# Patient Record
Sex: Female | Born: 1950 | Race: Black or African American | Hispanic: No | Marital: Married | Smoking: Never smoker
Health system: Southern US, Community
[De-identification: ages and names within clinical notes are randomized; demographics above are authoritative.]

## PROBLEM LIST (undated history)

## (undated) DIAGNOSIS — I10 Essential (primary) hypertension: Secondary | ICD-10-CM

## (undated) DIAGNOSIS — E785 Hyperlipidemia, unspecified: Secondary | ICD-10-CM

## (undated) DIAGNOSIS — K219 Gastro-esophageal reflux disease without esophagitis: Secondary | ICD-10-CM

## (undated) DIAGNOSIS — C801 Malignant (primary) neoplasm, unspecified: Secondary | ICD-10-CM

## (undated) HISTORY — DX: Malignant (primary) neoplasm, unspecified: C80.1

## (undated) HISTORY — DX: Hyperlipidemia, unspecified: E78.5

## (undated) HISTORY — PX: VAGINAL HYSTERECTOMY: SUR661

## (undated) HISTORY — DX: Essential (primary) hypertension: I10

## (undated) HISTORY — DX: Gastro-esophageal reflux disease without esophagitis: K21.9

## (undated) HISTORY — PX: TONSILLECTOMY: SUR1361

---

## 1998-04-30 ENCOUNTER — Other Ambulatory Visit: Admission: RE | Admit: 1998-04-30 | Discharge: 1998-04-30 | Payer: Self-pay | Admitting: Family Medicine

## 1999-04-29 ENCOUNTER — Other Ambulatory Visit: Admission: RE | Admit: 1999-04-29 | Discharge: 1999-04-29 | Payer: Self-pay | Admitting: Family Medicine

## 2000-05-04 ENCOUNTER — Other Ambulatory Visit: Admission: RE | Admit: 2000-05-04 | Discharge: 2000-05-04 | Payer: Self-pay | Admitting: Obstetrics and Gynecology

## 2001-06-28 ENCOUNTER — Other Ambulatory Visit: Admission: RE | Admit: 2001-06-28 | Discharge: 2001-06-28 | Payer: Self-pay | Admitting: Obstetrics and Gynecology

## 2002-08-01 ENCOUNTER — Other Ambulatory Visit: Admission: RE | Admit: 2002-08-01 | Discharge: 2002-08-01 | Payer: Self-pay | Admitting: Obstetrics and Gynecology

## 2003-06-16 ENCOUNTER — Other Ambulatory Visit: Admission: RE | Admit: 2003-06-16 | Discharge: 2003-06-16 | Payer: Self-pay | Admitting: Obstetrics and Gynecology

## 2004-07-23 ENCOUNTER — Ambulatory Visit: Payer: Self-pay | Admitting: Family Medicine

## 2004-08-13 ENCOUNTER — Ambulatory Visit: Payer: Self-pay | Admitting: Family Medicine

## 2004-10-18 ENCOUNTER — Ambulatory Visit: Payer: Self-pay | Admitting: Family Medicine

## 2004-10-28 ENCOUNTER — Ambulatory Visit: Payer: Self-pay | Admitting: Family Medicine

## 2004-11-04 ENCOUNTER — Encounter: Admission: RE | Admit: 2004-11-04 | Discharge: 2005-02-02 | Payer: Self-pay | Admitting: Family Medicine

## 2005-01-31 ENCOUNTER — Ambulatory Visit: Payer: Self-pay | Admitting: Family Medicine

## 2005-04-28 ENCOUNTER — Encounter: Admission: RE | Admit: 2005-04-28 | Discharge: 2005-07-27 | Payer: Self-pay | Admitting: Family Medicine

## 2005-07-01 ENCOUNTER — Ambulatory Visit: Payer: Self-pay | Admitting: Family Medicine

## 2005-08-26 ENCOUNTER — Ambulatory Visit: Payer: Self-pay | Admitting: Family Medicine

## 2005-09-23 ENCOUNTER — Ambulatory Visit: Payer: Self-pay | Admitting: Family Medicine

## 2005-11-06 ENCOUNTER — Ambulatory Visit: Payer: Self-pay | Admitting: Family Medicine

## 2006-09-08 ENCOUNTER — Ambulatory Visit: Payer: Self-pay | Admitting: Family Medicine

## 2006-09-08 LAB — CONVERTED CEMR LAB
Albumin: 3.8 g/dL (ref 3.5–5.2)
Alkaline Phosphatase: 36 units/L — ABNORMAL LOW (ref 39–117)
Basophils Absolute: 0 10*3/uL (ref 0.0–0.1)
Basophils Relative: 0.1 % (ref 0.0–1.0)
Bilirubin, Direct: 0.1 mg/dL (ref 0.0–0.3)
CO2: 30 meq/L (ref 19–32)
Chloride: 104 meq/L (ref 96–112)
Creatinine, Ser: 0.8 mg/dL (ref 0.4–1.2)
Eosinophils Relative: 2.8 % (ref 0.0–5.0)
HCT: 36.2 % (ref 36.0–46.0)
Hemoglobin: 11.8 g/dL — ABNORMAL LOW (ref 12.0–15.0)
Hgb A1c MFr Bld: 7.4 % — ABNORMAL HIGH (ref 4.6–6.0)
LDL Cholesterol: 81 mg/dL (ref 0–99)
MCHC: 32.5 g/dL (ref 30.0–36.0)
Microalb Creat Ratio: 4.5 mg/g (ref 0.0–30.0)
Microalb, Ur: 0.7 mg/dL (ref 0.0–1.9)
Neutrophils Relative %: 58.3 % (ref 43.0–77.0)
RBC: 4.99 M/uL (ref 3.87–5.11)
RDW: 12.9 % (ref 11.5–14.6)
Sodium: 140 meq/L (ref 135–145)
Total CHOL/HDL Ratio: 3.7
VLDL: 19 mg/dL (ref 0–40)
WBC: 6.3 10*3/uL (ref 4.5–10.5)

## 2006-09-11 DIAGNOSIS — M1990 Unspecified osteoarthritis, unspecified site: Secondary | ICD-10-CM

## 2006-09-11 DIAGNOSIS — K219 Gastro-esophageal reflux disease without esophagitis: Secondary | ICD-10-CM

## 2006-09-11 DIAGNOSIS — J309 Allergic rhinitis, unspecified: Secondary | ICD-10-CM | POA: Insufficient documentation

## 2006-09-14 ENCOUNTER — Ambulatory Visit: Payer: Self-pay | Admitting: Family Medicine

## 2006-11-20 ENCOUNTER — Encounter: Payer: Self-pay | Admitting: Family Medicine

## 2006-12-17 ENCOUNTER — Ambulatory Visit: Payer: Self-pay | Admitting: Family Medicine

## 2006-12-17 LAB — CONVERTED CEMR LAB
CO2: 29 meq/L (ref 19–32)
Calcium: 9.3 mg/dL (ref 8.4–10.5)
GFR calc Af Amer: 96 mL/min
GFR calc non Af Amer: 79 mL/min
Glucose, Bld: 141 mg/dL — ABNORMAL HIGH (ref 70–99)
Potassium: 4 meq/L (ref 3.5–5.1)
Sodium: 138 meq/L (ref 135–145)

## 2007-09-07 ENCOUNTER — Ambulatory Visit: Payer: Self-pay | Admitting: Family Medicine

## 2007-09-07 LAB — CONVERTED CEMR LAB
ALT: 24 units/L (ref 0–35)
AST: 19 units/L (ref 0–37)
Albumin: 3.5 g/dL (ref 3.5–5.2)
Alkaline Phosphatase: 40 units/L (ref 39–117)
BUN: 16 mg/dL (ref 6–23)
Basophils Absolute: 0 10*3/uL (ref 0.0–0.1)
Basophils Relative: 0.2 % (ref 0.0–1.0)
Bilirubin Urine: NEGATIVE
Bilirubin, Direct: 0.1 mg/dL (ref 0.0–0.3)
CO2: 28 meq/L (ref 19–32)
Calcium: 9 mg/dL (ref 8.4–10.5)
Chloride: 108 meq/L (ref 96–112)
Cholesterol: 112 mg/dL (ref 0–200)
Creatinine, Ser: 0.9 mg/dL (ref 0.4–1.2)
Creatinine,U: 135 mg/dL
Eosinophils Absolute: 0.1 10*3/uL (ref 0.0–0.7)
Eosinophils Relative: 2.3 % (ref 0.0–5.0)
GFR calc Af Amer: 84 mL/min
GFR calc non Af Amer: 69 mL/min
Glucose, Bld: 156 mg/dL — ABNORMAL HIGH (ref 70–99)
HCT: 35.4 % — ABNORMAL LOW (ref 36.0–46.0)
HDL: 26.2 mg/dL — ABNORMAL LOW (ref >39.0)
Hemoglobin: 11.1 g/dL — ABNORMAL LOW (ref 12.0–15.0)
Hgb A1c MFr Bld: 8.3 % — ABNORMAL HIGH (ref 4.6–6.0)
LDL Cholesterol: 71 mg/dL (ref 0–99)
Lymphocytes Relative: 33.6 % (ref 12.0–46.0)
MCHC: 31.3 g/dL (ref 30.0–36.0)
MCV: 73.4 fL — ABNORMAL LOW (ref 78.0–100.0)
Microalb Creat Ratio: 10.4 mg/g (ref 0.0–30.0)
Microalb, Ur: 1.4 mg/dL (ref 0.0–1.9)
Monocytes Absolute: 0.4 10*3/uL (ref 0.1–1.0)
Monocytes Relative: 7.4 % (ref 3.0–12.0)
Neutro Abs: 3.2 10*3/uL (ref 1.4–7.7)
Neutrophils Relative %: 56.5 % (ref 43.0–77.0)
Nitrite: NEGATIVE
Platelets: 194 10*3/uL (ref 150–400)
Potassium: 3.7 meq/L (ref 3.5–5.1)
RBC: 4.83 M/uL (ref 3.87–5.11)
RDW: 13.1 % (ref 11.5–14.6)
Sodium: 141 meq/L (ref 135–145)
TSH: 1.03 microintl units/mL (ref 0.35–5.50)
Total Bilirubin: 0.7 mg/dL (ref 0.3–1.2)
Total CHOL/HDL Ratio: 4.3
Total Protein: 6.8 g/dL (ref 6.0–8.3)
Triglycerides: 73 mg/dL (ref 0–149)
Urobilinogen, UA: 1
VLDL: 15 mg/dL (ref 0–40)
WBC Urine, dipstick: NEGATIVE
WBC: 5.5 10*3/uL (ref 4.5–10.5)

## 2007-09-14 ENCOUNTER — Ambulatory Visit: Payer: Self-pay | Admitting: Family Medicine

## 2007-09-14 DIAGNOSIS — E785 Hyperlipidemia, unspecified: Secondary | ICD-10-CM | POA: Insufficient documentation

## 2007-09-14 DIAGNOSIS — I10 Essential (primary) hypertension: Secondary | ICD-10-CM

## 2007-10-28 ENCOUNTER — Telehealth: Payer: Self-pay | Admitting: Family Medicine

## 2007-11-03 ENCOUNTER — Encounter: Payer: Self-pay | Admitting: *Deleted

## 2007-12-21 ENCOUNTER — Ambulatory Visit: Payer: Self-pay | Admitting: Family Medicine

## 2007-12-21 DIAGNOSIS — E109 Type 1 diabetes mellitus without complications: Secondary | ICD-10-CM

## 2008-01-09 DIAGNOSIS — R05 Cough: Secondary | ICD-10-CM | POA: Insufficient documentation

## 2008-01-10 ENCOUNTER — Ambulatory Visit: Payer: Self-pay | Admitting: Family Medicine

## 2008-01-10 DIAGNOSIS — J069 Acute upper respiratory infection, unspecified: Secondary | ICD-10-CM | POA: Insufficient documentation

## 2008-01-31 ENCOUNTER — Ambulatory Visit: Payer: Self-pay | Admitting: Family Medicine

## 2008-02-23 ENCOUNTER — Ambulatory Visit: Payer: Self-pay | Admitting: Family Medicine

## 2008-02-23 LAB — CONVERTED CEMR LAB
CO2: 30 meq/L (ref 19–32)
Chloride: 103 meq/L (ref 96–112)
Glucose, Bld: 116 mg/dL — ABNORMAL HIGH (ref 70–99)
Hgb A1c MFr Bld: 7 % — ABNORMAL HIGH (ref 4.6–6.0)
Sodium: 140 meq/L (ref 135–145)

## 2008-03-02 ENCOUNTER — Ambulatory Visit: Payer: Self-pay | Admitting: Family Medicine

## 2008-05-12 ENCOUNTER — Ambulatory Visit: Payer: Self-pay | Admitting: Family Medicine

## 2008-05-12 LAB — CONVERTED CEMR LAB
CO2: 30 meq/L (ref 19–32)
Chloride: 104 meq/L (ref 96–112)
Glucose, Bld: 72 mg/dL (ref 70–99)
Hgb A1c MFr Bld: 6.8 % — ABNORMAL HIGH (ref 4.6–6.0)
Potassium: 3.9 meq/L (ref 3.5–5.1)
Sodium: 141 meq/L (ref 135–145)

## 2008-05-22 ENCOUNTER — Ambulatory Visit: Payer: Self-pay | Admitting: Family Medicine

## 2008-06-26 ENCOUNTER — Ambulatory Visit: Payer: Self-pay | Admitting: Family Medicine

## 2008-09-15 ENCOUNTER — Ambulatory Visit: Payer: Self-pay | Admitting: Family Medicine

## 2008-09-15 LAB — CONVERTED CEMR LAB
Albumin: 3.7 g/dL (ref 3.5–5.2)
Alkaline Phosphatase: 46 units/L (ref 39–117)
BUN: 15 mg/dL (ref 6–23)
Basophils Absolute: 0 10*3/uL (ref 0.0–0.1)
Bilirubin Urine: NEGATIVE
CO2: 30 meq/L (ref 19–32)
Calcium: 9.1 mg/dL (ref 8.4–10.5)
Cholesterol: 114 mg/dL (ref 0–200)
Creatinine,U: 152.3 mg/dL
Eosinophils Absolute: 0.2 10*3/uL (ref 0.0–0.7)
Glucose, Bld: 117 mg/dL — ABNORMAL HIGH (ref 70–99)
Glucose, Urine, Semiquant: NEGATIVE
HDL: 40 mg/dL (ref >39.00)
Hemoglobin: 11.2 g/dL — ABNORMAL LOW (ref 12.0–15.0)
Ketones, urine, test strip: NEGATIVE
Lymphocytes Relative: 34.1 % (ref 12.0–46.0)
Lymphs Abs: 1.8 10*3/uL (ref 0.7–4.0)
MCHC: 32.7 g/dL (ref 30.0–36.0)
Microalb, Ur: 1.5 mg/dL (ref 0.0–1.9)
Neutro Abs: 3 10*3/uL (ref 1.4–7.7)
Platelets: 175 10*3/uL (ref 150.0–400.0)
RDW: 13.3 % (ref 11.5–14.6)
Sodium: 144 meq/L (ref 135–145)
Specific Gravity, Urine: 1.02
TSH: 1.1 microintl units/mL (ref 0.35–5.50)
Triglycerides: 71 mg/dL (ref 0.0–149.0)
WBC Urine, dipstick: NEGATIVE
pH: 6

## 2008-09-22 ENCOUNTER — Ambulatory Visit: Payer: Self-pay | Admitting: Family Medicine

## 2008-09-22 DIAGNOSIS — D649 Anemia, unspecified: Secondary | ICD-10-CM

## 2008-11-20 ENCOUNTER — Telehealth: Payer: Self-pay | Admitting: Family Medicine

## 2009-02-19 ENCOUNTER — Encounter (INDEPENDENT_AMBULATORY_CARE_PROVIDER_SITE_OTHER): Payer: Self-pay | Admitting: *Deleted

## 2009-02-22 ENCOUNTER — Encounter: Payer: Self-pay | Admitting: Family Medicine

## 2009-04-11 ENCOUNTER — Ambulatory Visit: Payer: Self-pay | Admitting: Family Medicine

## 2009-04-11 LAB — CONVERTED CEMR LAB
BUN: 14 mg/dL (ref 6–23)
Calcium: 9.3 mg/dL (ref 8.4–10.5)
Creatinine, Ser: 0.8 mg/dL (ref 0.4–1.2)
GFR calc non Af Amer: 94.97 mL/min (ref >60)
Hgb A1c MFr Bld: 7.1 % — ABNORMAL HIGH (ref 4.6–6.5)

## 2009-04-30 ENCOUNTER — Ambulatory Visit: Payer: Self-pay | Admitting: Family Medicine

## 2009-07-16 ENCOUNTER — Ambulatory Visit: Payer: Self-pay | Admitting: Family Medicine

## 2009-07-16 LAB — CONVERTED CEMR LAB
Calcium: 8.7 mg/dL (ref 8.4–10.5)
Creatinine, Ser: 0.7 mg/dL (ref 0.4–1.2)
Hgb A1c MFr Bld: 6.7 % — ABNORMAL HIGH (ref 4.6–6.5)

## 2009-07-19 DIAGNOSIS — B029 Zoster without complications: Secondary | ICD-10-CM | POA: Insufficient documentation

## 2009-07-23 ENCOUNTER — Ambulatory Visit: Payer: Self-pay | Admitting: Family Medicine

## 2009-08-01 ENCOUNTER — Telehealth: Payer: Self-pay | Admitting: Family Medicine

## 2009-08-14 ENCOUNTER — Telehealth: Payer: Self-pay | Admitting: Family Medicine

## 2009-09-28 ENCOUNTER — Ambulatory Visit: Payer: Self-pay | Admitting: Family Medicine

## 2009-09-28 LAB — CONVERTED CEMR LAB
ALT: 22 units/L (ref 0–35)
AST: 17 units/L (ref 0–37)
Albumin: 3.9 g/dL (ref 3.5–5.2)
Basophils Absolute: 0 10*3/uL (ref 0.0–0.1)
Basophils Relative: 0.3 % (ref 0.0–3.0)
Eosinophils Relative: 2 % (ref 0.0–5.0)
GFR calc non Af Amer: 90.87 mL/min (ref >60)
Glucose, Bld: 85 mg/dL (ref 70–99)
Glucose, Urine, Semiquant: NEGATIVE
HCT: 35.3 % — ABNORMAL LOW (ref 36.0–46.0)
Hemoglobin: 11.4 g/dL — ABNORMAL LOW (ref 12.0–15.0)
Lymphs Abs: 2.1 10*3/uL (ref 0.7–4.0)
Monocytes Relative: 7.3 % (ref 3.0–12.0)
Neutro Abs: 3.4 10*3/uL (ref 1.4–7.7)
Potassium: 4.3 meq/L (ref 3.5–5.1)
Protein, U semiquant: NEGATIVE
RBC: 4.77 M/uL (ref 3.87–5.11)
RDW: 15.1 % — ABNORMAL HIGH (ref 11.5–14.6)
Sodium: 142 meq/L (ref 135–145)
Specific Gravity, Urine: 1.015
TSH: 1.32 microintl units/mL (ref 0.35–5.50)
Total Protein: 7.1 g/dL (ref 6.0–8.3)
VLDL: 14.4 mg/dL (ref 0.0–40.0)
WBC Urine, dipstick: NEGATIVE
pH: 6

## 2009-10-05 ENCOUNTER — Ambulatory Visit: Payer: Self-pay | Admitting: Family Medicine

## 2009-11-30 ENCOUNTER — Encounter: Payer: Self-pay | Admitting: Family Medicine

## 2009-11-30 ENCOUNTER — Ambulatory Visit: Payer: Self-pay | Admitting: Internal Medicine

## 2010-01-04 ENCOUNTER — Ambulatory Visit: Payer: Self-pay | Admitting: Family Medicine

## 2010-01-04 LAB — CONVERTED CEMR LAB
BUN: 16 mg/dL (ref 6–23)
CO2: 31 meq/L (ref 19–32)
Chloride: 103 meq/L (ref 96–112)
Creatinine, Ser: 0.8 mg/dL (ref 0.4–1.2)
Glucose, Bld: 94 mg/dL (ref 70–99)
Potassium: 4.3 meq/L (ref 3.5–5.1)

## 2010-01-11 ENCOUNTER — Ambulatory Visit: Payer: Self-pay | Admitting: Family Medicine

## 2010-03-05 ENCOUNTER — Encounter: Payer: Self-pay | Admitting: Family Medicine

## 2010-04-05 ENCOUNTER — Ambulatory Visit: Payer: Self-pay | Admitting: Family Medicine

## 2010-04-05 LAB — CONVERTED CEMR LAB
BUN: 13 mg/dL (ref 6–23)
CO2: 27 meq/L (ref 19–32)
Calcium: 8.6 mg/dL (ref 8.4–10.5)
Chloride: 104 meq/L (ref 96–112)
Creatinine, Ser: 0.7 mg/dL (ref 0.4–1.2)
Hgb A1c MFr Bld: 7.1 % — ABNORMAL HIGH (ref 4.6–6.5)

## 2010-04-16 ENCOUNTER — Telehealth: Payer: Self-pay | Admitting: Family Medicine

## 2010-04-18 ENCOUNTER — Ambulatory Visit
Admission: RE | Admit: 2010-04-18 | Discharge: 2010-04-18 | Payer: Self-pay | Source: Home / Self Care | Attending: Family Medicine | Admitting: Family Medicine

## 2010-05-14 NOTE — Assessment & Plan Note (Signed)
Summary: CPX/NO PAP/NJR   Vital Signs:  Patient profile:   60 year old female Menstrual status:  postmenopausal Height:      66 inches Weight:      179 pounds Temp:     98.1 degrees F oral BP sitting:   120 / 80  (left arm) Cuff size:   regular  Vitals Entered By: Kern Reap CMA Duncan Dull) (October 05, 2009 9:44 AM) CC: cpx   CC:  cpx.  History of Present Illness: Anna Wiggins is a 60 year old female, nonsmoker, who comes in today for evaluation of diabetes, hypertension.  Her diabetes is treated with Glucotrol 10 mg b.i.d., Glucophage 500 mg b.i.d., and insulin NovoLog mixed 70 -- 3020 units daily.  Blood sugar varies from 80 to 180.  She states that she follows a strict diet.  Her blood sugar stays fairly normal.  If it doesn't her blood sugar goes up.  Sugar the other day was 85, however, hemoglobin A1c7 .1%.  She also has hyperlipidemia treated with Zocor 40 mg nightly lipids are at goal with an LDL of 50.  She also has underlying hypertension, for which he takes Cozaar 50 mg daily, Inocor, thiazide 25 mg daily.  BP 120/80.  She gets routine eye care......... September 2011 Dr. Vonna Kotyk.......Marland Kitchen exam September 2010, normal.  No retinopathy.... colonoscopy 2002, normal tetanus 2005, seasonal flu 2010, Pneumovax 2006.  She does BSE monthly gets annual mammography and sees her GYN yearly.  She's been using estrogen ring for about 5 years.  Diabetes Management History:      She says that she is exercising.  Type of exercise includes: walk.  She is doing this 5 times per week.    Allergies: No Known Drug Allergies  Past History:  Past medical, surgical, family and social histories (including risk factors) reviewed, and no changes noted (except as noted below).  Past Medical History: Reviewed history from 03/02/2008 and no changes required. GERD PMS VI Diabetes mellitus, type Ii Allergic rhinitis Osteoarthritis childbirth x 1status post hysterectomy for nonmalignant reasons.  Ovaries  were left intact tonsillectomy Hyperlipidemia Hypertension Diabetes mellitus, type I cough secondary to ACE inhibitor  Past Surgical History: Reviewed history from 09/11/2006 and no changes required. CBX1 SIP (OVARIES)OK TAH  T&A  Family History: Reviewed history from 09/14/2007 and no changes required. father died in his 34s.  Pneumonia.  He was a smoker.  Mother died 32, lung cancer smoker, and diabetes.  Two brothers, once a diabetic and in good health two sisters in good health  Social History: Reviewed history from 09/14/2007 and no changes required. Regular exercise-yes Occupation: Married Never Smoked Alcohol use-no Drug use-no  Review of Systems      See HPI  Physical Exam  General:  Well-developed,well-nourished,in no acute distress; alert,appropriate and cooperative throughout examination Head:  Normocephalic and atraumatic without obvious abnormalities. No apparent alopecia or balding. Eyes:  No corneal or conjunctival inflammation noted. EOMI. Perrla. Funduscopic exam benign, without hemorrhages, exudates or papilledema. Vision grossly normal. Ears:  External ear exam shows no significant lesions or deformities.  Otoscopic examination reveals clear canals, tympanic membranes are intact bilaterally without bulging, retraction, inflammation or discharge. Hearing is grossly normal bilaterally. Nose:  External nasal examination shows no deformity or inflammation. Nasal mucosa are pink and moist without lesions or exudates. Mouth:  Oral mucosa and oropharynx without lesions or exudates.  Teeth in good repair. Neck:  No deformities, masses, or tenderness noted. Chest Wall:  No deformities, masses, or tenderness noted. Breasts:  No mass, nodules, thickening, tenderness, bulging, retraction, inflamation, nipple discharge or skin changes noted.   Lungs:  Normal respiratory effort, chest expands symmetrically. Lungs are clear to auscultation, no crackles or  wheezes. Heart:  Normal rate and regular rhythm. S1 and S2 normal without gallop, murmur, click, rub or other extra sounds. Abdomen:  Bowel sounds positive,abdomen soft and non-tender without masses, organomegaly or hernias noted. Msk:  No deformity or scoliosis noted of thoracic or lumbar spine.   Pulses:  R and L carotid,radial,femoral,dorsalis pedis and posterior tibial pulses are full and equal bilaterally Extremities:  No clubbing, cyanosis, edema, or deformity noted with normal full range of motion of all joints.   Neurologic:  No cranial nerve deficits noted. Station and gait are normal. Plantar reflexes are down-going bilaterally. DTRs are symmetrical throughout. Sensory, motor and coordinative functions appear intact. Skin:  total body skin exam normal Cervical Nodes:  No lymphadenopathy noted Axillary Nodes:  No palpable lymphadenopathy Inguinal Nodes:  No significant adenopathy Psych:  Cognition and judgment appear intact. Alert and cooperative with normal attention span and concentration. No apparent delusions, illusions, hallucinations  Diabetes Management Exam:    Foot Exam (with socks and/or shoes not present):       Sensory-Pinprick/Light touch:          Left medial foot (L-4): normal          Left dorsal foot (L-5): normal          Left lateral foot (S-1): normal          Right medial foot (L-4): normal          Right dorsal foot (L-5): normal          Right lateral foot (S-1): normal       Sensory-Monofilament:          Left foot: normal          Right foot: normal       Inspection:          Left foot: normal          Right foot: normal       Nails:          Left foot: normal          Right foot: normal    Eye Exam:       Eye Exam done elsewhere          Date: 12/27/2008          Results: normal          Done by: bevis   Impression & Recommendations:  Problem # 1:  PHYSICAL EXAMINATION (ICD-V70.0) Assessment Unchanged  Orders: Prescription Created  Electronically [phone])  Problem # 2:  DIABETES MELLITUS, TYPE I (ICD-250.01) Assessment: Improved  Her updated medication list for this problem includes:    Glucotrol Xl 10 Mg Tb24 (Glipizide) .Marland Kitchen... Take 1 tablet by mouth twice a day    Novolog Mix 70/30 70-30 % Susp (Insulin aspart prot & aspart) .Marland Kitchen... 20 u daily    Cozaar 50 Mg Tabs (Losartan potassium) .Marland Kitchen... Take 1 tablet by mouth every morning    Glucophage 500 Mg Tabs (Metformin hcl) .Marland Kitchen... Take one tab two times a day  Orders: Prescription Created Electronically [phone])  Problem # 3:  HYPERTENSION (ICD-401.9) Assessment: Improved  Her updated medication list for this problem includes:    Hydrochlorothiazide 25 Mg Tabs (Hydrochlorothiazide) .Marland Kitchen... 1 tablet by mouth every morning    Cozaar 50 Mg Tabs (  Losartan potassium) .Marland Kitchen... Take 1 tablet by mouth every morning  Orders: Prescription Created Electronically [phone])  Problem # 4:  HYPERLIPIDEMIA (ICD-272.4) Assessment: Improved  Her updated medication list for this problem includes:    Zocor 40 Mg Tabs (Simvastatin) .Marland Kitchen... 1 tab @ bedtime  Orders: Prescription Created Electronically [phone])  Complete Medication List: 1)  Femring 0.1 Mg/24hr Ring (Estradiol acetate) .... Insert 1 ring into vagina as directed 2)  Glucotrol Xl 10 Mg Tb24 (Glipizide) .... Take 1 tablet by mouth twice a day 3)  Hydrochlorothiazide 25 Mg Tabs (Hydrochlorothiazide) .Marland Kitchen.. 1 tablet by mouth every morning 4)  Onetouch Ultra Test Strp (Glucose blood) .... Use 1 strip twice a day 5)  Potassium Chloride Crys Cr 20 Meq Tbcr (Potassium chloride crys cr) .... Once daily 6)  Zocor 40 Mg Tabs (Simvastatin) .Marland Kitchen.. 1 tab @ bedtime 7)  Novolog Mix 70/30 70-30 % Susp (Insulin aspart prot & aspart) .... 20 u daily 8)  Cozaar 50 Mg Tabs (Losartan potassium) .... Take 1 tablet by mouth every morning 9)  Hydromet 5-1.5 Mg/100ml Syrp (Hydrocodone-homatropine) .Marland Kitchen.. 1 or 2 tsps at bedtime 10)  Bd Ultra-fine 33 Lancets Misc  (Lancets) .... Use daily as directed by your doctor 11)  Glucophage 500 Mg Tabs (Metformin hcl) .... Take one tab two times a day 12)  [facility] Insulin Syringe 30g X 1/2" 0.3 Ml Misc (Insulin syringe-needle u-100) .... Inject 20  units at bedtime  Other Orders: T-Bone Densitometry (30865)  Diabetes Management Assessment/Plan:      Her blood pressure goal is < 130/80.    Patient Instructions: 1)  Please schedule a follow-up appointment in 3 months. 2)  BMP prior to visit, ICD-9: 3)  HbgA1C prior to visit, ICD-9: 4)  It is important that you exercise regularly at least 20 minutes 5 times a week. If you develop chest pain, have severe difficulty breathing, or feel very tired , stop exercising immediately and seek medical attention. 5)  Take calcium +Vitamin D daily. 6)  Take an Aspirin every day. 7)  Check your blood sugars regularly. If your readings are usually above : or below 70 you should contact our office. 8)  It is important that your Diabetic A1c level is checked every 3 months. 9)  See your eye doctor yearly to check for diabetic eye damage. 10)  Check your feet each night for sore areas, calluses or signs of infection. 11)  Check your Blood Pressure regularly. If it is above: you should make an appointment. Prescriptions: GLUCOPHAGE 500 MG TABS (METFORMIN HCL) take one tab two times a day  #200 x 3   Entered and Authorized by:   Roderick Pee MD   Signed by:   Roderick Pee MD on 10/05/2009   Method used:   Faxed to ...       [facility]       [address] [place], [place]  [address]       Ph: [phone]       Fax: [phone]   RxID:   [id number] BD ULTRA-FINE 33 LANCETS  MISC (LANCETS) use daily as directed by your doctor  #100 x 3   Entered and Authorized by:   Roderick Pee MD   Signed by:   Roderick Pee MD on 10/05/2009   Method used:   Faxed to ...       [facility]  [address] [place], [place]  [address]       Ph:  [phone]       Fax: [phone]   RxID:   [id number] COZAAR 50 MG TABS (LOSARTAN POTASSIUM) Take 1 tablet by mouth every morning  #100 x 4   Entered and Authorized by:   Roderick Pee MD   Signed by:   Roderick Pee MD on 10/05/2009   Method used:   Faxed to ...       [facility] (mail[facility]order)       [address] [place], [place]  [address]       Ph: [phone]       Fax: [phone]   RxID:   [id number] NOVOLOG MIX 70/30 70-30 % SUSP (INSULIN ASPART PROT & ASPART) 20 u daily  #2 vials x 6   Entered and Authorized by:   Roderick Pee MD   Signed by:   Roderick Pee MD on 10/05/2009   Method used:   Faxed to ...       [facility] (mail[facility]order)       [address] [place], [place]  [address]       Ph: [phone]       Fax: [phone]   RxID:   [id number] ZOCOR 40 MG  TABS (SIMVASTATIN) 1 tab @ bedtime  #100 x 4   Entered and Authorized by:   Roderick Pee MD   Signed by:   Roderick Pee MD on 10/05/2009   Method used:   Faxed to ...       [facility] (mail-order)       [address] [place], [place]  [address]       Ph: [phone]       Fax: [phone]   RxID:   [id number] POTASSIUM CHLORIDE CRYS CR 20 MEQ TBCR (POTASSIUM CHLORIDE CRYS CR) once daily  #100 x 4   Entered and Authorized by:   Roderick Pee MD   Signed by:   Roderick Pee MD on 10/05/2009   Method used:   Faxed to ...       [facility] (mail[facility]order)       [address] [place], [place]  [address]       Ph: [phone]       Fax: [phone]   RxID:   [id number] [place] TEST  STRP (GLUCOSE BLOOD) Use 1 strip twice a day  #100 x 4   Entered and Authorized by:   Roderick Pee MD   Signed by:   Roderick Pee MD on 10/05/2009   Method used:   Faxed to ...       [facility] (mail[facility]order)       [address] [place], [place]  [address]       Ph: [phone]       Fax: [phone]   RxID:   [id number] HYDROCHLOROTHIAZIDE 25 MG TABS  (HYDROCHLOROTHIAZIDE) 1 tablet by mouth every morning  #100 x 4   Entered and Authorized by:   Roderick Pee MD   Signed by:   Roderick Pee MD on 10/05/2009   Method used:   Faxed to ...       [facility])  [address] [place], [place]  [address]       Ph: [phone]       Fax: [phone]   RxID:   [id number] GLUCOTROL XL 10 MG TB24 (GLIPIZIDE) Take 1 tablet by mouth twice a day  #200 x 4   Entered and Authorized by:   Roderick Pee MD   Signed by:   Roderick Pee MD on 10/05/2009   Method used:   Faxed to ...       [facility] (mail[facility]order)       [address] [place], [place]  [address]       Ph: [phone]       Fax: [phone]   RxID:   [id number]    Immunization History:  Influenza Immunization History:    Influenza:  historical (01/12/2009)

## 2010-05-14 NOTE — Assessment & Plan Note (Signed)
Summary: 3 MONTH ROV/NJR   Vital Signs:  Patient profile:   60 year old female Menstrual status:  postmenopausal Weight:      177 pounds Temp:     98.3 degrees F oral  Vitals Entered By: Kern Reap CMA Duncan Dull) (July 23, 2009 8:54 AM)  CC: follow-up visit Is Patient Diabetic? Yes Pain Assessment Patient in pain? no        CC:  follow-up visit.  History of Present Illness: Anna Wiggins is a 60 year old female, who comes in today for evaluation of two problems.  Her last hemoglobin A1c was 7.1%.  We increased her insulin to 20 units daily.  She continues her oral medication.  Fasting blood sugar now 87 with a hemoglobin A1c of 6.7.  No hypoglycemia.  Last Thursday she began having pain in the left side of her back.  She then noticed a red vesicular type rash that extends under her left breast.  The pain is constant.  A7 on a scale of one to 10  Allergies: No Known Drug Allergies  Review of Systems      See HPI  Physical Exam  General:  Well-developed,well-nourished,in no acute distress; alert,appropriate and cooperative throughout examination Skin:  skin rash, consistent with shingles  Diabetes Management Exam:    Eye Exam:       Eye Exam done elsewhere          Date: 12/13/2008          Results: normal          Done by: bevis   Impression & Recommendations:  Problem # 1:  HERPES ZOSTER (ICD-053.9) Assessment New  Orders: Prescription Created Electronically [id number])  Problem # 2:  DIABETES MELLITUS, TYPE I (ICD-250.01) Assessment: Improved  Her updated medication list for this problem includes:    Glucotrol Xl 10 Mg Tb24 (Glipizide) .Marland Kitchen... Take 1 tablet by mouth twice a day    Novolog Mix 70/30 70-30 % Susp (Insulin aspart prot & aspart) .Marland Kitchen... 20 u daily    Cozaar 50 Mg Tabs (Losartan potassium) .Marland Kitchen... Take 1 tablet by mouth every morning    Glucophage 500 Mg Tabs (Metformin hcl) .Marland Kitchen... Take one tab two times a day  Orders: Prescription Created Electronically  [phone])  Complete Medication List: 1)  Femring 0.1 Mg/24hr Ring (Estradiol acetate) .... Insert 1 ring into vagina as directed 2)  Glucotrol Xl 10 Mg Tb24 (Glipizide) .... Take 1 tablet by mouth twice a day 3)  Hydrochlorothiazide 25 Mg Tabs (Hydrochlorothiazide) .Marland Kitchen.. 1 tablet by mouth every morning 4)  Nexium 40 Mg Cpdr (Esomeprazole magnesium) .... Take 1 capsule by mouth twice a day 5)  Onetouch Ultra Test Strp (Glucose blood) .... Use 1 strip twice a day 6)  Potassium Chloride Crys Cr 20 Meq Tbcr (Potassium chloride crys cr) .... Once daily 7)  Zocor 40 Mg Tabs (Simvastatin) .Marland Kitchen.. 1 tab @ bedtime 8)  Novolog Mix 70/30 70-30 % Susp (Insulin aspart prot & aspart) .... 20 u daily 9)  Cozaar 50 Mg Tabs (Losartan potassium) .... Take 1 tablet by mouth every morning 10)  Hydromet 5-1.5 Mg/63ml Syrp (Hydrocodone-homatropine) .Marland Kitchen.. 1 or 2 tsps at bedtime 11)  Bd Ultra-fine 33 Lancets Misc (Lancets) .... Use daily as directed by your doctor 12)  Glucophage 500 Mg Tabs (Metformin hcl) .... Take one tab two times a day 13)  [facility]vs Insulin Syringe 30g X 1/2" 0.3 Ml Misc (Insulin syringe-needle u-100) .... Inject 10 units at bedtime 14)  Zovirax 400  Mg Tabs (Acyclovir) .... 2 by mouth three times a day 15)  Vicodin Es 7.5-750 Mg Tabs (Hydrocodone-acetaminophen) .... Uad  Patient Instructions: 1)  begin Zovirax 800 mg 3 times daily.  Also, Motrin 400 mg daily for minor pain, Vicodin ES, one half to one tablet every 4 to 6 hours as needed for severe pain. 2)  Return p.r.n.Marland Kitchen 3)  Continue your current treatment program for your diabetes, you doing an excellent job.  Return in June.......annual exam 4)  BMP prior to visit, ICD-9:                             250.01 5)  Hepatic Panel prior to visit, ICD-9: 6)  Lipid Panel prior to visit, ICD-9: 7)  TSH prior to visit, ICD-9: 8)  CBC w/ Diff prior to visit, ICD-9: 9)  Urine-dip prior to visit, ICD-9: 10)  HbgA1C prior to visit, ICD-9: 11)  Urine Microalbumin  prior to visit, ICD-9: Prescriptions: VICODIN ES 7.5-750 MG TABS (HYDROCODONE-ACETAMINOPHEN) UAD  #40 x 1   Entered and Authorized by:   Roderick Pee MD   Signed by:   Roderick Pee MD on 07/23/2009   Method used:   Print then Give to Patient   RxID:   [id number] ZOVIRAX 400 MG TABS (ACYCLOVIR) 2 by mouth three times a day  #60 x 2   Entered and Authorized by:   Roderick Pee MD   Signed by:   Roderick Pee MD on 07/23/2009   Method used:   [facility]ronically to        [id number]       [address]       [place], [place]  [address]       Ph: [phone]       Fax: [phone]   RxID:   [id number]

## 2010-05-14 NOTE — Assessment & Plan Note (Signed)
Summary: 6 month rov/njr/pt rsc/cjr   Vital Signs:  Patient profile:   60 year old female Menstrual status:  postmenopausal Height:      65.5 inches Weight:      179 pounds BMI:     29.44 Temp:     98.4 degrees F oral BP sitting:   140 / 86  (left arm) Cuff size:   regular  Vitals Entered By: Kern Reap CMA Duncan Dull) (April 30, 2009 10:00 AM)  Reason for Visit 6 month follow up   History of Present Illness: Anna Wiggins is a 60 year old, married female, nonsmoker comes in today for follow-up of diabetes, type I.  She is on Glucotrol, 10 mg b.i.d., Glucophage 500 mg b.i.d., and NovoLog 70 3014 units nightly.  We are not able to increase the Glucophage because of GI side effects.  Her blood sugars at home are running normal however.  A1c is 7.1.  We discussed very his options.  Will increase her insulin from 14 units a day to 20 units a day, and she is to start a walking program.  Will follow-up in 3 months.  BP at home on Cozaar 50 mg daily and HCTZ 25 mg daily 130/80.  Her husbands having prostate cancer surgery in February.  Allergies: No Known Drug Allergies  Past History:  Past medical, surgical, family and social histories (including risk factors) reviewed for relevance to current acute and chronic problems.  Past Medical History: Reviewed history from 03/02/2008 and no changes required. GERD PMS VI Diabetes mellitus, type Ii Allergic rhinitis Osteoarthritis childbirth x 1status post hysterectomy for nonmalignant reasons.  Ovaries were left intact tonsillectomy Hyperlipidemia Hypertension Diabetes mellitus, type I cough secondary to ACE inhibitor  Past Surgical History: Reviewed history from 09/11/2006 and no changes required. CBX1 SIP (OVARIES)OK TAH  T&A  Family History: Reviewed history from 09/14/2007 and no changes required. father died in his 29s.  Pneumonia.  He was a smoker.  Mother died 66, lung cancer smoker, and diabetes.  Two brothers,  once a diabetic and in good health two sisters in good health  Social History: Reviewed history from 09/14/2007 and no changes required. Regular exercise-yes Occupation: Married Never Smoked Alcohol use-no Drug use-no  Review of Systems      See HPI  Physical Exam  General:  Well-developed,well-nourished,in no acute distress; alert,appropriate and cooperative throughout examination  Diabetes Management Exam:    Eye Exam:       Eye Exam not due   Impression & Recommendations:  Problem # 1:  DIABETES MELLITUS, TYPE I (ICD-250.01) Assessment Unchanged  Her updated medication list for this problem includes:    Glucotrol Xl 10 Mg Tb24 (Glipizide) .Marland Kitchen... Take 1 tablet by mouth twice a day    Novolog Mix 70/30 70-30 % Susp (Insulin aspart prot & aspart) .Marland Kitchen... 14 u daily    Cozaar 50 Mg Tabs (Losartan potassium) .Marland Kitchen... Take 1 tablet by mouth every morning    Glucophage 500 Mg Tabs (Metformin hcl) .Marland Kitchen... Take one tab two times a day  Orders: Prescription Created Electronically [phone])  Problem # 2:  HYPERTENSION (ICD-401.9) Assessment: Improved  Her updated medication list for this problem includes:    Hydrochlorothiazide 25 Mg Tabs (Hydrochlorothiazide) .Marland Kitchen... 1 tablet by mouth every morning    Cozaar 50 Mg Tabs (Losartan potassium) .Marland Kitchen... Take 1 tablet by mouth every morning  Orders: Prescription Created Electronically [phone])  Complete Medication List: 1)  Femring 0.1 Mg/24hr Ring (Estradiol acetate) .... Insert 1 ring into vagina  as directed 2)  Glucotrol Xl 10 Mg Tb24 (Glipizide) .... Take 1 tablet by mouth twice a day 3)  Hydrochlorothiazide 25 Mg Tabs (Hydrochlorothiazide) .Marland Kitchen.. 1 tablet by mouth every morning 4)  Nexium 40 Mg Cpdr (Esomeprazole magnesium) .... Take 1 capsule by mouth twice a day 5)  Onetouch Ultra Test Strp (Glucose blood) .... Use 1 strip twice a day 6)  Potassium Chloride Crys Cr 20 Meq Tbcr (Potassium chloride crys cr) .... Once daily 7)  Zocor 40 Mg  Tabs (Simvastatin) .Marland Kitchen.. 1 tab @ bedtime 8)  Novolog Mix 70/30 70-30 % Susp (Insulin aspart prot & aspart) .Marland Kitchen.. 14 u daily 9)  Cozaar 50 Mg Tabs (Losartan potassium) .... Take 1 tablet by mouth every morning 10)  Hydromet 5-1.5 Mg/83ml Syrp (Hydrocodone-homatropine) .Marland Kitchen.. 1 or 2 tsps at bedtime 11)  Bd Ultra-fine 33 Lancets Misc (Lancets) .... Use daily as directed by your doctor 12)  Glucophage 500 Mg Tabs (Metformin hcl) .... Take one tab two times a day 13)  [facility]vs Insulin Syringe 30g X 1/2" 0.3 Ml Misc (Insulin syringe-needle u-100) .... Inject 10 units at bedtime  Patient Instructions: 1)  See your eye doctor yearly to check for diabetic eye damage. 2)  increase the insulin to 20 units daily.  Check a fasting blood sugar daily.  If your blood sugar drops below 50 and then go back to 14 units a day and call me. 3)  Please schedule a follow-up appointment in 3 months. 4)  BMP prior to visit, ICD-9:............250.01 5)  HbgA1C prior to visit, ICD-9:

## 2010-05-14 NOTE — Miscellaneous (Signed)
Summary: BONE DENSITY  Clinical Lists Changes  Orders: Added new Test order of T-Lumbar Vertebral Assessment (77082) - Signed 

## 2010-05-14 NOTE — Progress Notes (Signed)
Summary: letter for work  Phone Note Call from Patient Call back at [employer] [phone]   Caller: Patient---live call Summary of Call: Would like for Fleet Contras to call her concerning her Shingles. Initial call taken by: Warnell Forester,  August 01, 2009 9:40 AM  Follow-up for Phone Call        patient has been out of work since last tuesday because of pain from shingles.  she plans on returning to work on Monday.  can she have a note for work? Follow-up by: Kern Reap CMA Duncan Dull),  August 01, 2009 11:21 AM  Additional Follow-up for Phone Call Additional follow up Details #1::        ok    Additional Follow-up for Phone Call Additional follow up Details #2::    letter ready for pick up Follow-up by: Kern Reap CMA Duncan Dull),  August 01, 2009 11:45 AM

## 2010-05-14 NOTE — Assessment & Plan Note (Signed)
Summary: 3 month rov/njr   Vital Signs:  Patient profile:   60 year old female Menstrual status:  postmenopausal Weight:      174 pounds Temp:     98.2 degrees F oral BP sitting:   160 / 84  (left arm) Cuff size:   regular  Vitals Entered By: Kern Reap CMA Duncan Dull) (January 11, 2010 9:19 AM) CC: follow-up visit   CC:  follow-up visit.  History of Present Illness: Anna Wiggins is a 60 year old female, who comes in today for follow-up of diabetes and hypertension.  Her diabetes is treated with Glucophage 500 mg b.i.d., Glucotrol, 10 mg b.i.d., NovoLog mix 7030 20 units nightly.  Morning blood sugars in the 80 and 90s.  However, A1c 7.6%.  We discussed switching her insulin to the morning, but is too inconvenient for her because she does not eat breakfast until she gets to work.  She does not want a breakfast earlier and she does not want to take her insulin to work.  With diet and, exercise.  She's lost 5 pounds.  BP at home very from 135/85 to 150/90.  Hopefully, with increased weight loss will leave her antihypertensive medication alone.  We can see her blood pressure drop too.  Allergies: No Known Drug Allergies  Past History:  Past medical, surgical, family and social histories (including risk factors) reviewed for relevance to current acute and chronic problems.  Past Medical History: Reviewed history from 03/02/2008 and no changes required. GERD PMS VI Diabetes mellitus, type Ii Allergic rhinitis Osteoarthritis childbirth x 1status post hysterectomy for nonmalignant reasons.  Ovaries were left intact tonsillectomy Hyperlipidemia Hypertension Diabetes mellitus, type I cough secondary to ACE inhibitor  Past Surgical History: Reviewed history from 09/11/2006 and no changes required. CBX1 SIP (OVARIES)OK TAH  T&A  Family History: Reviewed history from 09/14/2007 and no changes required. father died in his 8s.  Pneumonia.  He was a smoker.  Mother died 9,  lung cancer smoker, and diabetes.  Two brothers, once a diabetic and in good health two sisters in good health  Social History: Reviewed history from 09/14/2007 and no changes required. Regular exercise-yes Occupation: Married Never Smoked Alcohol use-no Drug use-no  Review of Systems      See HPI  Physical Exam  General:  Well-developed,well-nourished,in no acute distress; alert,appropriate and cooperative throughout examination   Impression & Recommendations:  Problem # 1:  DIABETES MELLITUS, TYPE I (ICD-250.01) Assessment Improved  Her updated medication list for this problem includes:    Glucotrol Xl 10 Mg Tb24 (Glipizide) .Marland Kitchen... Take 1 tablet by mouth twice a day    Novolog Mix 70/30 70-30 % Susp (Insulin aspart prot & aspart) .Marland Kitchen... 20 u daily    Cozaar 50 Mg Tabs (Losartan potassium) .Marland Kitchen... Take 1 tablet by mouth every morning    Glucophage 500 Mg Tabs (Metformin hcl) .Marland Kitchen... Take one tab two times a day  Problem # 2:  HYPERTENSION (ICD-401.9) Assessment: Unchanged  Her updated medication list for this problem includes:    Hydrochlorothiazide 25 Mg Tabs (Hydrochlorothiazide) .Marland Kitchen... 1 tablet by mouth every morning    Cozaar 50 Mg Tabs (Losartan potassium) .Marland Kitchen... Take 1 tablet by mouth every morning  Complete Medication List: 1)  Femring 0.1 Mg/24hr Ring (Estradiol acetate) .... Insert 1 ring into vagina as directed 2)  Glucotrol Xl 10 Mg Tb24 (Glipizide) .... Take 1 tablet by mouth twice a day 3)  Hydrochlorothiazide 25 Mg Tabs (Hydrochlorothiazide) .Marland Kitchen.. 1 tablet by mouth every morning 4)  Onetouch Ultra Test Strp (Glucose blood) .... Use 1 strip twice a day 5)  Potassium Chloride Crys Cr 20 Meq Tbcr (Potassium chloride crys cr) .... Once daily 6)  Zocor 40 Mg Tabs (Simvastatin) .Marland Kitchen.. 1 tab @ bedtime 7)  Novolog Mix 70/30 70-30 % Susp (Insulin aspart prot & aspart) .... 20 u daily 8)  Cozaar 50 Mg Tabs (Losartan potassium) .... Take 1 tablet by mouth every morning 9)   Hydromet 5-1.5 Mg/20ml Syrp (Hydrocodone-homatropine) .Marland Kitchen.. 1 or 2 tsps at bedtime 10)  Glucophage 500 Mg Tabs (Metformin hcl) .... Take one tab two times a day 11)  [facility] Insulin Syringe 30g X 1/2" 0.3 Ml Misc (Insulin syringe-needle u-100) .... Inject 20  units at bedtime  Patient Instructions: 1)    Continue current medications. 2)  Continue diet and exercise program. 3)  Please schedule a follow-up appointment in 3 months..250.01 4)  BMP prior to visit, ICD-9: 5)  HbgA1C prior to visit, ICD-9:

## 2010-05-14 NOTE — Progress Notes (Signed)
Summary: Pt called req status of Anna Wiggins Note Call from Anna Wiggins Call back at [employer] [phone]   Caller: Anna Wiggins Summary of Call: Pt called to check status of dissablity claim to [employer]. Pt says she signed HIPPA on 08/02/09 and [employer] hasnt rcvd any information.  Initial call taken by: Lucy Antigua,  Aug 14, 2009 2:47 PM  Follow-up for Phone Call        Meriel Flavors. not sure where this goes Follow-up by: Roderick Pee MD,  Aug 14, 2009 3:28 PM  Additional Follow-up for Phone Call Additional follow up Details #1::        re faxed papers Additional Follow-up by: Kern Reap CMA Duncan Dull),  Aug 15, 2009 12:03 PM

## 2010-05-14 NOTE — Miscellaneous (Signed)
Summary: mammogram  Clinical Lists Changes  Observations: Added new observation of MAMMO DUE: 03/2011 (03/05/2010 11:43) Added new observation of MAMMOGRAM: normal (03/01/2010 11:44)      Preventive Care Screening  Mammogram:    Date:  03/01/2010    Next Due:  03/2011    Results:  normal

## 2010-05-16 NOTE — Progress Notes (Signed)
Summary: cough and pink eye  Phone Note Call from Patient Call back at Home Phone [phone]   Caller: Patient Reason for Call: Talk to Doctor Summary of Call: patient is calling because she has a cough with congestion.  She is also complaining of possible pink eye.  any suggestions? [facility] [address] Initial call taken by: Kern Reap CMA Duncan Dull),  April 16, 2010 10:49 AM  Follow-up for Phone Call        left message on machine for patient to return our call Follow-up by: Kern Reap CMA Duncan Dull),  April 16, 2010 12:45 PM  Additional Follow-up for Phone Call Additional follow up Details #1::        rx not called in patient went to urgent care Additional Follow-up by: Kern Reap CMA Duncan Dull),  April 16, 2010 5:04 PM    generic hydromet  6 oz  one tsp every 6 hrs as needed

## 2010-05-16 NOTE — Assessment & Plan Note (Signed)
Summary: 3 month fup//ccm/pt rsc from bmp/cjr/pt rsc/cjr   Vital Signs:  Patient profile:   60 year old female Menstrual status:  postmenopausal Height:      66 inches Weight:      178 pounds BMI:     28.83 Temp:     98.1 degrees F oral BP sitting:   142 / 88  (left arm) Cuff size:   regular  Vitals Entered By: Kern Reap CMA Duncan Dull) (April 18, 2010 10:08 AM) CC: follow-up visit Is Patient Diabetic? Yes   CC:  follow-up visit.  History of Present Illness: Anna Wiggins is a 60 year old female, nonsmoker, type 1 diabetes, who comes in today for follow-up of diabetes, and a new problem, which is a viral syndrome.  She takes Glucotrol 10 mg b.i.d., metformin 500 mg b.i.d., NovoLog mix 703020 units nightly morning.  Blood sugars range from 8200.  Today she came in her blood sugar was 55.  A1c7.1.  We discussed the concept of tight control.  Since her overall sugars are fairly normal.  I would leave her medication as is.  She also has had congestion, sore throat, and cough.  She went to an urgent care was given eyedrops, and amoxicillin........... stop the antibiotic okay to go back to work  Allergies: No Known Drug Allergies  Past History:  Past medical, surgical, family and social histories (including risk factors) reviewed for relevance to current acute and chronic problems.  Past Medical History: Reviewed history from 03/02/2008 and no changes required. GERD PMS VI Diabetes mellitus, type Ii Allergic rhinitis Osteoarthritis childbirth x 1status post hysterectomy for nonmalignant reasons.  Ovaries were left intact tonsillectomy Hyperlipidemia Hypertension Diabetes mellitus, type I cough secondary to ACE inhibitor  Past Surgical History: Reviewed history from 09/11/2006 and no changes required. CBX1 SIP (OVARIES)OK TAH  T&A  Family History: Reviewed history from 09/14/2007 and no changes required. father died in his 56s.  Pneumonia.  He was a smoker.  Mother  died 77, lung cancer smoker, and diabetes.  Two brothers, once a diabetic and in good health two sisters in good health  Social History: Reviewed history from 09/14/2007 and no changes required. Regular exercise-yes Occupation: Married Never Smoked Alcohol use-no Drug use-no  Review of Systems      See HPI  Physical Exam  General:  Well-developed,well-nourished,in no acute distress; alert,appropriate and cooperative throughout examination Head:  Normocephalic and atraumatic without obvious abnormalities. No apparent alopecia or balding. Eyes:  No corneal or conjunctival inflammation noted. EOMI. Perrla. Funduscopic exam benign, without hemorrhages, exudates or papilledema. Vision grossly normal. Ears:  External ear exam shows no significant lesions or deformities.  Otoscopic examination reveals clear canals, tympanic membranes are intact bilaterally without bulging, retraction, inflammation or discharge. Hearing is grossly normal bilaterally. Nose:  External nasal examination shows no deformity or inflammation. Nasal mucosa are pink and moist without lesions or exudates. Mouth:  Oral mucosa and oropharynx without lesions or exudates.  Teeth in good repair. Neck:  No deformities, masses, or tenderness noted. Chest Wall:  No deformities, masses, or tenderness noted. Lungs:  Normal respiratory effort, chest expands symmetrically. Lungs are clear to auscultation, no crackles or wheezes.   Impression & Recommendations:  Problem # 1:  DIABETES MELLITUS, TYPE I (ICD-250.01) Assessment Improved  Her updated medication list for this problem includes:    Glucotrol Xl 10 Mg Tb24 (Glipizide) .Marland Kitchen... Take 1 tablet by mouth twice a day    Novolog Mix 70/30 70-30 % Susp (Insulin aspart prot &  aspart) .Marland Kitchen... 20 u daily    Cozaar 50 Mg Tabs (Losartan potassium) .Marland Kitchen... Take 1 tablet by mouth every morning    Glucophage 500 Mg Tabs (Metformin hcl) .Marland Kitchen... Take one tab two times a day  Complete  Medication List: 1)  Femring 0.1 Mg/24hr Ring (Estradiol acetate) .... Insert 1 ring into vagina as directed 2)  Glucotrol Xl 10 Mg Tb24 (Glipizide) .... Take 1 tablet by mouth twice a day 3)  Hydrochlorothiazide 25 Mg Tabs (Hydrochlorothiazide) .Marland Kitchen.. 1 tablet by mouth every morning 4)  Onetouch Ultra Test Strp (Glucose blood) .... Use 1 strip twice a day 5)  Potassium Chloride Crys Cr 20 Meq Tbcr (Potassium chloride crys cr) .... Once daily 6)  Zocor 40 Mg Tabs (Simvastatin) .Marland Kitchen.. 1 tab @ bedtime 7)  Novolog Mix 70/30 70-30 % Susp (Insulin aspart prot & aspart) .... 20 u daily 8)  Cozaar 50 Mg Tabs (Losartan potassium) .... Take 1 tablet by mouth every morning 9)  Hydromet 5-1.5 Mg/25ml Syrp (Hydrocodone-homatropine) .Marland Kitchen.. 1 or 2 tsps at bedtime 10)  Glucophage 500 Mg Tabs (Metformin hcl) .... Take one tab two times a day 11)  [facility] Insulin Syringe 30g X 1/2" 0.3 Ml Misc (Insulin syringe-needle u-100) .... Inject 20  units at bedtime 12)  Ciprofloxacin Hcl 0.3 % Soln (Ciprofloxacin hcl) .... Use as directed 13)  Augmentin 875-125 Mg Tabs (Amoxicillin-pot clavulanate) .... Take one tab two times a day 14)  Hydromet 5-1.5 Mg/77ml Syrp (Hydrocodone-homatropine) .... 1/2 to 1 tsp at bedtime as needed  Patient Instructions: 1)  Please schedule a follow-up appointment in 3 months. 2)  BMP prior to visit, ICD-9: 3)  HbgA1C prior to visit, ICD-9: Prescriptions: HYDROMET 5-1.5 MG/5ML SYRP (HYDROCODONE-HOMATROPINE) 1/2 to 1 tsp at bedtime as needed  #4 oz x 1   Entered and Authorized by:   Roderick Pee MD   Signed by:   Roderick Pee MD on 04/18/2010   Method used:   Print then Give to Patient   RxID:   [id number]    Orders Added: 1)  Est. Patient Level III [01027]

## 2010-07-12 ENCOUNTER — Other Ambulatory Visit: Payer: Self-pay

## 2010-07-22 ENCOUNTER — Ambulatory Visit: Payer: Self-pay | Admitting: Family Medicine

## 2010-08-23 ENCOUNTER — Encounter: Payer: Self-pay | Admitting: Gastroenterology

## 2010-08-23 ENCOUNTER — Ambulatory Visit (AMBULATORY_SURGERY_CENTER): Payer: 59 | Admitting: *Deleted

## 2010-08-23 VITALS — Ht 66.0 in | Wt 176.6 lb

## 2010-08-23 DIAGNOSIS — Z1211 Encounter for screening for malignant neoplasm of colon: Secondary | ICD-10-CM

## 2010-08-23 MED ORDER — PEG-KCL-NACL-NASULF-NA ASC-C 100 G PO SOLR: ORAL | Status: DC

## 2010-09-04 ENCOUNTER — Encounter: Payer: Self-pay | Admitting: Gastroenterology

## 2010-09-04 ENCOUNTER — Ambulatory Visit (AMBULATORY_SURGERY_CENTER): Payer: 59 | Admitting: Gastroenterology

## 2010-09-04 VITALS — BP 158/72 | HR 55 | Temp 96.7°F | Resp 13 | Ht 66.0 in | Wt 172.0 lb

## 2010-09-04 DIAGNOSIS — D126 Benign neoplasm of colon, unspecified: Secondary | ICD-10-CM

## 2010-09-04 DIAGNOSIS — K5730 Diverticulosis of large intestine without perforation or abscess without bleeding: Secondary | ICD-10-CM

## 2010-09-04 DIAGNOSIS — Z1211 Encounter for screening for malignant neoplasm of colon: Secondary | ICD-10-CM

## 2010-09-04 LAB — GLUCOSE, CAPILLARY: Glucose-Capillary: 135 mg/dL — ABNORMAL HIGH (ref 70–99)

## 2010-09-04 MED ORDER — SODIUM CHLORIDE 0.9 % IV SOLN: 500.0000 mL | INTRAVENOUS | Status: AC

## 2010-09-04 NOTE — Patient Instructions (Signed)
Findings:  Mild Diverticulosis, Polyp  Recommendations:  High Fiber Diet  Discharge and teaching documents explained and given to pt and care giver

## 2010-09-05 ENCOUNTER — Telehealth: Payer: Self-pay

## 2010-09-05 NOTE — Telephone Encounter (Signed)

## 2010-09-11 ENCOUNTER — Encounter: Payer: Self-pay | Admitting: Gastroenterology

## 2010-10-04 ENCOUNTER — Other Ambulatory Visit (INDEPENDENT_AMBULATORY_CARE_PROVIDER_SITE_OTHER): Payer: 59

## 2010-10-04 DIAGNOSIS — Z0000 Encounter for general adult medical examination without abnormal findings: Secondary | ICD-10-CM

## 2010-10-04 LAB — POCT URINALYSIS DIPSTICK
Bilirubin, UA: NEGATIVE
Nitrite, UA: NEGATIVE
pH, UA: 7

## 2010-10-04 LAB — CBC WITH DIFFERENTIAL/PLATELET
Basophils Relative: 0.3 % (ref 0.0–3.0)
Eosinophils Absolute: 0.1 10*3/uL (ref 0.0–0.7)
Eosinophils Relative: 1.5 % (ref 0.0–5.0)
HCT: 37.2 % (ref 36.0–46.0)
Hemoglobin: 11.8 g/dL — ABNORMAL LOW (ref 12.0–15.0)
Lymphs Abs: 1.8 10*3/uL (ref 0.7–4.0)
MCHC: 31.6 g/dL (ref 30.0–36.0)
MCV: 73 fl — ABNORMAL LOW (ref 78.0–100.0)
Monocytes Absolute: 0.5 10*3/uL (ref 0.1–1.0)
Neutro Abs: 5.3 10*3/uL (ref 1.4–7.7)
Neutrophils Relative %: 68.7 % (ref 43.0–77.0)
RBC: 5.09 Mil/uL (ref 3.87–5.11)

## 2010-10-04 LAB — HEPATIC FUNCTION PANEL
ALT: 25 U/L (ref 0–35)
AST: 17 U/L (ref 0–37)
Albumin: 4.2 g/dL (ref 3.5–5.2)
Alkaline Phosphatase: 60 U/L (ref 39–117)
Total Protein: 7.3 g/dL (ref 6.0–8.3)

## 2010-10-04 LAB — BASIC METABOLIC PANEL
CO2: 30 mEq/L (ref 19–32)
Chloride: 105 mEq/L (ref 96–112)
Creatinine, Ser: 0.8 mg/dL (ref 0.4–1.2)
Potassium: 4.3 mEq/L (ref 3.5–5.1)

## 2010-10-04 LAB — LIPID PANEL
HDL: 42.5 mg/dL (ref >39.00)
Total CHOL/HDL Ratio: 3

## 2010-10-04 LAB — MICROALBUMIN / CREATININE URINE RATIO: Creatinine,U: 178.5 mg/dL

## 2010-10-11 ENCOUNTER — Encounter: Payer: Self-pay | Admitting: Family Medicine

## 2010-10-15 ENCOUNTER — Other Ambulatory Visit: Payer: Self-pay | Admitting: *Deleted

## 2010-10-15 MED ORDER — SIMVASTATIN 40 MG PO TABS: 40.0000 mg | ORAL_TABLET | Freq: Every day | ORAL | Status: DC

## 2010-10-15 MED ORDER — HYDROCHLOROTHIAZIDE 25 MG PO TABS: 25.0000 mg | ORAL_TABLET | Freq: Every day | ORAL | Status: DC

## 2010-10-15 MED ORDER — POTASSIUM CHLORIDE CRYS ER 20 MEQ PO TBCR: 20.0000 meq | EXTENDED_RELEASE_TABLET | Freq: Every day | ORAL | Status: DC

## 2010-10-15 MED ORDER — "INSULIN SYRINGE-NEEDLE U-100 30G X 1/2"" 0.3 ML MISC": 1.0000 | Status: DC

## 2010-10-15 MED ORDER — GLIPIZIDE ER 10 MG PO TB24: 10.0000 mg | ORAL_TABLET | Freq: Two times a day (BID) | ORAL | Status: DC

## 2010-10-15 MED ORDER — LOSARTAN POTASSIUM 50 MG PO TABS: 50.0000 mg | ORAL_TABLET | Freq: Every day | ORAL | Status: DC

## 2010-10-15 MED ORDER — METFORMIN HCL 500 MG PO TABS: 500.0000 mg | ORAL_TABLET | Freq: Two times a day (BID) | ORAL | Status: DC

## 2010-10-15 MED ORDER — INSULIN ASPART PROT & ASPART (70-30 MIX) 100 UNIT/ML ~~LOC~~ SUSP: 20.0000 [IU] | Freq: Every evening | SUBCUTANEOUS | Status: DC

## 2010-10-15 MED ORDER — GLUCOSE BLOOD VI STRP: 1.0000 | ORAL_STRIP | Freq: Every day | Status: DC

## 2010-10-15 MED ORDER — MECLIZINE HCL 25 MG PO TABS: 25.0000 mg | ORAL_TABLET | Freq: Three times a day (TID) | ORAL | Status: AC | PRN

## 2010-10-17 ENCOUNTER — Ambulatory Visit (INDEPENDENT_AMBULATORY_CARE_PROVIDER_SITE_OTHER): Payer: 59 | Admitting: Family Medicine

## 2010-10-17 ENCOUNTER — Encounter: Payer: Self-pay | Admitting: Family Medicine

## 2010-10-17 DIAGNOSIS — E785 Hyperlipidemia, unspecified: Secondary | ICD-10-CM

## 2010-10-17 DIAGNOSIS — K219 Gastro-esophageal reflux disease without esophagitis: Secondary | ICD-10-CM

## 2010-10-17 DIAGNOSIS — I10 Essential (primary) hypertension: Secondary | ICD-10-CM

## 2010-10-17 DIAGNOSIS — M1990 Unspecified osteoarthritis, unspecified site: Secondary | ICD-10-CM

## 2010-10-17 DIAGNOSIS — E109 Type 1 diabetes mellitus without complications: Secondary | ICD-10-CM

## 2010-10-17 NOTE — Patient Instructions (Signed)
Continue the Glucotrol and the metformin as you currently are doing.  However, increased her insulin to 25 units at bedtime.  Check a fasting blood sugar daily in the morning if in one week your blood sugar is normal,,,,,,,,,, around the hundred,,,,,,,,, then continue 25 units daily.  If not increase it to 30 units.  In other words, increase her insulin by 5 units every week and to fasting blood sugar drops to 100.  Return in 5 weeks to see me for follow-up with a record of all your blood sugars

## 2010-10-17 NOTE — Progress Notes (Signed)
  Subjective:    Patient ID: Anna Wiggins, female    DOB: Aug 28, 1950, 60 y.o.   MRN: [id number]  HPILoretta is a delightful, 60 year old female, who comes in today for annual physical examination because of a history of type 1 diabetes, hyperlipidemia, and hypertension.  A new problem of intermittent vertigo.  Her diabetes is been treated with Glucotrol 10 mg b.i.d., Glucophage, 500 mg b.i.d., insulin, 7030 dose 20 units nightly, blood sugar in the 80 to 90 range.  A1c pending.  Her hypertension is treated with Cozaar 50 mg daily and had a heart size 25 mg daily.  She was on a study gave her cough.  She takes simvastatin 40 mg nightly along with an 81-mg baby aspirin for hyperlipidemia.  She also takes black cohosh for hot flushes, sees her GYN annually because a history of cervical cancer.  Uterus removed in 1986.  She gets routine eye care, dental care, BSE monthly, annual mammogram, colonoscopy 2012 normal.  Tetanus 2005.    Review of Systems  Constitutional: Negative.   HENT: Negative.   Eyes: Negative.   Respiratory: Negative.   Cardiovascular: Negative.   Gastrointestinal: Negative.   Genitourinary: Negative.   Musculoskeletal: Negative.   Neurological: Negative.   Hematological: Negative.   Psychiatric/Behavioral: Negative.        Objective:   Physical Exam  Constitutional: She appears well-developed and well-nourished.  HENT:  Head: Normocephalic and atraumatic.  Right Ear: External ear normal.  Left Ear: External ear normal.  Nose: Nose normal.  Mouth/Throat: Oropharynx is clear and moist.  Eyes: EOM are normal. Pupils are equal, round, and reactive to light.  Neck: Normal range of motion. Neck supple. No thyromegaly present.  Cardiovascular: Normal rate, regular rhythm, normal heart sounds and intact distal pulses.  Exam reveals no gallop and no friction rub.   No murmur heard. Pulmonary/Chest: Effort normal and breath sounds normal.  Abdominal: Soft. Bowel  sounds are normal. She exhibits no distension and no mass. There is no tenderness. There is no rebound.  Genitourinary: No vaginal discharge found.       Bilateral breast exam normal  Musculoskeletal: Normal range of motion.  Lymphadenopathy:    She has no cervical adenopathy.  Neurological: She is alert. She has normal reflexes. No cranial nerve deficit. She exhibits normal muscle tone. Coordination normal.  Skin: Skin is warm and dry.  Psychiatric: She has a normal mood and affect. Her behavior is normal. Judgment and thought content normal.          Assessment & Plan:  Healthy female.  Type I diabetic continue current medication.  Hypertension.  Continue current medication.  Hyperlipidemia.  Continue current medication.  Benign positional vertigo stopped the meclizine.  History of cervical cancer 1986.  Therefore, she is a carrier.  Advised to see her GYN as needed

## 2010-11-21 ENCOUNTER — Encounter: Payer: Self-pay | Admitting: Family Medicine

## 2010-11-21 ENCOUNTER — Ambulatory Visit (INDEPENDENT_AMBULATORY_CARE_PROVIDER_SITE_OTHER): Payer: 59 | Admitting: Family Medicine

## 2010-11-21 VITALS — Temp 98.7°F | Wt 174.0 lb

## 2010-11-21 DIAGNOSIS — E109 Type 1 diabetes mellitus without complications: Secondary | ICD-10-CM

## 2010-11-21 NOTE — Progress Notes (Signed)
  Subjective:    Patient ID: Anna Wiggins, female    DOB: 04/11/1951, 60 y.o.   MRN: [id number]  HPI  Shavelle is a delightful, 60 year old female type I diabetic, who comes in today for follow-up.  We increased her insulin from 20 units to 30 and asked her to be more compliant with her diet and walk 30 minutes.  A day.  She's done that and her blood sugars have dropped back to normal.  Her fasting sugars now in the 80 to 90 range.  No hypoglycemia.    Review of Systems    General and metabolic review of systems otherwise negative Objective:   Physical Exam  Well-developed well-nourished, female, in no acute distress      Assessment & Plan:  Diabetes type 1, under improved control.  Continue diet, exercise and increase insulin, 30 units daily follow-up in two months

## 2010-11-21 NOTE — Patient Instructions (Signed)
Continue your current treatment program specifically, your daily, exercise.  Follow-up in two months fasting labs one week prior

## 2011-01-10 ENCOUNTER — Other Ambulatory Visit (INDEPENDENT_AMBULATORY_CARE_PROVIDER_SITE_OTHER): Payer: 59

## 2011-01-10 DIAGNOSIS — E109 Type 1 diabetes mellitus without complications: Secondary | ICD-10-CM

## 2011-01-10 LAB — HEMOGLOBIN A1C: Hgb A1c MFr Bld: 7.5 % — ABNORMAL HIGH (ref 4.6–6.5)

## 2011-01-10 LAB — BASIC METABOLIC PANEL
Chloride: 107 mEq/L (ref 96–112)
Creatinine, Ser: 0.9 mg/dL (ref 0.4–1.2)
Potassium: 4.1 mEq/L (ref 3.5–5.1)

## 2011-01-21 ENCOUNTER — Ambulatory Visit (INDEPENDENT_AMBULATORY_CARE_PROVIDER_SITE_OTHER): Payer: 59 | Admitting: Family Medicine

## 2011-01-21 ENCOUNTER — Encounter: Payer: Self-pay | Admitting: Family Medicine

## 2011-01-21 VITALS — BP 124/86 | Temp 98.4°F | Wt 175.0 lb

## 2011-01-21 DIAGNOSIS — E109 Type 1 diabetes mellitus without complications: Secondary | ICD-10-CM

## 2011-01-21 MED ORDER — INSULIN ASPART PROT & ASPART (70-30 MIX) 100 UNIT/ML ~~LOC~~ SUSP: SUBCUTANEOUS | Status: DC

## 2011-01-21 NOTE — Progress Notes (Signed)
  Subjective:    Patient ID: Anna Wiggins, female    DOB: 16-Jul-1950, 60 y.o.   MRN: [id number]  [name]  is a 60 year old female, nonsmoker, who comes in today for follow-up of diabetes, type I.  We have increased her NovoLog 70 -- 30 to 30 units nightly however, her morning blood sugars have dropped in the 50 and 60 range.  These she therefore is cut her dose to 20 units nightly now.  Her afternoon blood sugars were elevated.  Will go ahead and split her dose is.  She is walking now on a regular basis and try to do better with her diet.  Weight is steady at 175    Review of Systems General and metabolic review of systems otherwise negative   Objective:   Physical Exam  Well-developed well-nourished, female, in no acute distress      Assessment & Plan:  Diabetes type 198.  Goal plan split dose insulin 15 units b.i.d. Fasting blood sugar daily.  Continue diet exercise follow up A1c in 3 months

## 2011-01-21 NOTE — Patient Instructions (Signed)
split  insulin, take 15 units before breakfast and 15 units before evening meal.  Check a fasting blood sugar daily in the morning.  Return in 3 months for follow-up, sooner if any problems.  Lab work one week prior....... nonfasting

## 2011-01-31 LAB — HM DIABETES EYE EXAM

## 2011-02-04 ENCOUNTER — Encounter: Payer: Self-pay | Admitting: Family Medicine

## 2011-04-25 ENCOUNTER — Other Ambulatory Visit (INDEPENDENT_AMBULATORY_CARE_PROVIDER_SITE_OTHER): Payer: 59

## 2011-04-25 DIAGNOSIS — E109 Type 1 diabetes mellitus without complications: Secondary | ICD-10-CM

## 2011-04-25 LAB — HEMOGLOBIN A1C: Hgb A1c MFr Bld: 7 % — ABNORMAL HIGH (ref 4.6–6.5)

## 2011-04-25 LAB — BASIC METABOLIC PANEL
Chloride: 103 mEq/L (ref 96–112)
GFR: 101.59 mL/min (ref >60.00)
Potassium: 4 mEq/L (ref 3.5–5.1)
Sodium: 141 mEq/L (ref 135–145)

## 2011-05-01 ENCOUNTER — Ambulatory Visit (INDEPENDENT_AMBULATORY_CARE_PROVIDER_SITE_OTHER): Payer: 59 | Admitting: Family Medicine

## 2011-05-01 ENCOUNTER — Encounter: Payer: Self-pay | Admitting: Family Medicine

## 2011-05-01 DIAGNOSIS — I10 Essential (primary) hypertension: Secondary | ICD-10-CM

## 2011-05-01 DIAGNOSIS — E109 Type 1 diabetes mellitus without complications: Secondary | ICD-10-CM

## 2011-05-01 NOTE — Patient Instructions (Signed)
Take an extra 50 mg of losartan.  This afternoon when you get home from work.  Tomorrow morning.  Increase the Losartin  to 100 mg daily.  Check a blood pressure daily in the morning.  Return in two weeks with the data and the device

## 2011-05-01 NOTE — Progress Notes (Signed)
  Subjective:    Patient ID: Anna Wiggins, female    DOB: 1951/02/20, 61 y.o.   MRN: [id number]  HPI The is a 61 year old, married female, nonsmoker, who comes in today for follow-up of diabetes and hypertension.  She is on Glucotrol 10 mg b.i.d., metformin 500 mg b.i.d., and 15 units of insulin b.i.d..  Fasting blood sugar now 101.  See down to 7.0%.  No hypoglycemia.  She takes Cozaar 50 mg daily, along with the diuretic 25 mg daily, and blood pressure today, right arm sitting position is 190/90.  She's not been monitoring her blood pressure at home.  No complaints of headache.   Review of Systems    General metabolic cardiovascular review of systems otherwise negative Objective:   Physical Exam  Well-developed well-nourished, female, in no acute distress.  BP right arm sitting position 190/90, pulse 70 and regular      Assessment & Plan:  Diabetes that goal continue current therapy.  Hypertension not at goal,,,,,,,,,, take 8, extra 50 mg, Cozaar tablet this afternoon, then increase the daily dose to 100 mg daily BP check.  Daily follow-up in two weeks

## 2011-05-06 ENCOUNTER — Encounter: Payer: Self-pay | Admitting: Family Medicine

## 2011-05-06 ENCOUNTER — Telehealth: Payer: Self-pay | Admitting: *Deleted

## 2011-05-06 ENCOUNTER — Ambulatory Visit (INDEPENDENT_AMBULATORY_CARE_PROVIDER_SITE_OTHER): Payer: 59 | Admitting: Family Medicine

## 2011-05-06 VITALS — BP 200/90 | HR 70 | Temp 98.0°F | Wt 179.0 lb

## 2011-05-06 DIAGNOSIS — E109 Type 1 diabetes mellitus without complications: Secondary | ICD-10-CM

## 2011-05-06 DIAGNOSIS — I10 Essential (primary) hypertension: Secondary | ICD-10-CM

## 2011-05-06 MED ORDER — FUROSEMIDE 20 MG PO TABS: 20.0000 mg | ORAL_TABLET | Freq: Every day | ORAL | Status: DC

## 2011-05-06 MED ORDER — AMLODIPINE BESYLATE 10 MG PO TABS: 10.0000 mg | ORAL_TABLET | Freq: Every day | ORAL | Status: DC

## 2011-05-06 NOTE — Telephone Encounter (Signed)
Pt states her BP is running 200/94 today, and she is taking the Losartan 100 mg daily.

## 2011-05-06 NOTE — Progress Notes (Signed)
  Subjective:    Patient ID: Anna Wiggins, female    DOB: 1951-02-26, 61 y.o.   MRN: [id number]  HPI L. Is a 61 year old, married female, nonsmoker, who comes in today for evaluation of hypertension.  We saw her last week for routine follow-up on her diabetes.  Her blood sugar was normal.  A1c7 .0%, and renal function was normal with a GFR of 101, and normal creatinine and BUN.  At that time.  Her blood pressure was 160 systolic and therefore, we increased her Cozaar from 50 mg daily to 100.  Today at work.  She doesn't feel well went to the nurse, and they checked her BP at work.  It was 200/94.  They monitored her during the day and blood pressure continues to stay elevated.  Therefore, they sent her to see [place].  Issues been compliant with her medication.  BP right arm sitting position by me 200/80.  Pulse 70 and regular.  She was given clonidine .2, also, a beta-blocker, 10 mg.  Her blood pressure was monitored Q15 minutes x 3.After an hour, and her blood pressure dropped to 180/80, and I felt comfortable letting her go home  Because of the dramatic increase in her blood pressure.  We will set her up for renal artery scan tomorrow to rule out renal artery stenosis   Review of Systems    General and cardiovascular review of systems otherwise negative Objective:   Physical Exam  Well-developed well-nourished, female, in no acute distress.  BP is above      Assessment & Plan:  A sudden dramatic rise in blood pressure.  Plan,,,,,,,,,,,,, bedrest at home, Lasix, 20 daily, add Norvasc 5 mg, continue Cozaar 100 mg, discontinue the hydrochlorothiazide, blood pressure check at home 3 times daily, renal ultrasound tomorrow.  Rule out renal artery stenosis, follow-up on Thursday

## 2011-05-06 NOTE — Patient Instructions (Signed)
Complete salt free diet.  Bed rest at home.  Continued the Cozaar 100 mg daily in the morning.  At 10 mg of Norvasc and 20 mg of Lasix now,,,,,,,,,,,,, then one of each every morning starting tomorrow morning.  We will call you about your ultrasound.  Check your blood pressure 3 to 4 times daily.  Return Thursday for follow-up.  Stop the hydrochlorothiazide

## 2011-05-06 NOTE — Telephone Encounter (Signed)
Patient here for an office visit 

## 2011-05-07 ENCOUNTER — Other Ambulatory Visit: Payer: Self-pay | Admitting: Family Medicine

## 2011-05-07 ENCOUNTER — Ambulatory Visit
Admission: RE | Admit: 2011-05-07 | Discharge: 2011-05-07 | Disposition: A | Discharge status: Home / Self Care | Payer: 59 | Source: Ambulatory Visit | Attending: Family Medicine | Admitting: Family Medicine

## 2011-05-07 DIAGNOSIS — I10 Essential (primary) hypertension: Secondary | ICD-10-CM

## 2011-05-07 MED ORDER — IOHEXOL 350 MG/ML SOLN
100.0000 mL | Freq: Once | INTRAVENOUS | Status: AC | PRN
  Administered 2011-05-07: 100 mL via INTRAVENOUS

## 2011-05-08 ENCOUNTER — Encounter: Payer: Self-pay | Admitting: Family Medicine

## 2011-05-08 ENCOUNTER — Ambulatory Visit (INDEPENDENT_AMBULATORY_CARE_PROVIDER_SITE_OTHER): Payer: 59 | Admitting: Family Medicine

## 2011-05-08 DIAGNOSIS — I10 Essential (primary) hypertension: Secondary | ICD-10-CM

## 2011-05-08 NOTE — Patient Instructions (Signed)
Continue your current medications add 5 mg of the beta blocker,,,,,,,,,,, one now, then one every morning,,,,,,,,,, BP checked 3 times daily,,,,,,,,,,,,,,,, return Saturday morning at 9 a.m. For follow-up

## 2011-05-08 NOTE — Progress Notes (Signed)
  Subjective:    Patient ID: Anna Wiggins, female    DOB: 08-03-1950, 61 y.o.   MRN: [id number]  Anna Wiggins is a 61 year old, married female, nonsmoker, who comes in today for follow-up of hypertension.  We saw her 3 days ago with marked elevation of her blood pressure 190 over hundred.  We gave her clonidine and beta blocker and her pressure dropped to 170 systolic, and we sent her home at bed rest and increase the Cozaar from 50 mg daily to 100 and added a 20-mg Lasix tablet.  She comes back today, blood pressure still elevated, 17180 systolic, diastolic in the 90s.  Because of her marked elevation in blood pressure.  We did a CTA, which showed no evidence of renal artery stenosis.  She had a 7-mm cystic lesion in the right lobe of her liver.  The radiologist recommended a follow-up CT scan because she had a history of uterine cancer.  The uterus was removed in 1986.  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  No recurrence.  Ovaries were left intact.  Therefore, do not feel this is indicated  Review of Systems    General and cardiovascular review of systems otherwise negative Objective:   Physical Exam  Well-developed well-nourished, female, in no acute distress.  BP right arm sitting position 180/90, pulse 70 and regular      Assessment & Plan:  Hypertension not at goal.  Plan,,,,,,,,,,, at 5 milligrams of a beta-blocker continue other medications.  Follow-up Saturday, and the Saturday clinic at 9 a.m.

## 2011-05-12 ENCOUNTER — Ambulatory Visit (INDEPENDENT_AMBULATORY_CARE_PROVIDER_SITE_OTHER): Payer: 59 | Admitting: Family Medicine

## 2011-05-12 ENCOUNTER — Encounter: Payer: Self-pay | Admitting: Family Medicine

## 2011-05-12 ENCOUNTER — Encounter: Payer: Self-pay | Admitting: *Deleted

## 2011-05-12 VITALS — BP 160/80

## 2011-05-12 DIAGNOSIS — I10 Essential (primary) hypertension: Secondary | ICD-10-CM

## 2011-05-12 LAB — BASIC METABOLIC PANEL
Calcium: 9.2 mg/dL (ref 8.4–10.5)
Creatinine, Ser: 0.9 mg/dL (ref 0.4–1.2)
GFR: 81.26 mL/min (ref >60.00)
Glucose, Bld: 198 mg/dL — ABNORMAL HIGH (ref 70–99)
Sodium: 142 mEq/L (ref 135–145)

## 2011-05-12 MED ORDER — ATENOLOL 50 MG PO TABS: 50.0000 mg | ORAL_TABLET | Freq: Every day | ORAL | Status: DC

## 2011-05-12 MED ORDER — LOSARTAN POTASSIUM 100 MG PO TABS: 100.0000 mg | ORAL_TABLET | Freq: Every day | ORAL | Status: DC

## 2011-05-12 NOTE — Patient Instructions (Signed)
In the morning take a 10-mg Norvasc tablet, 100 mg of Cozaar, 20 mg of Lasix and the 50-mg Tenormin tablet.  Ambulate tomorrow to be sure your blood pressure is okay.  If you blood pressure is, okay it's okay to go back to work on Wednesday.  Return in one week for follow-up

## 2011-05-12 NOTE — Progress Notes (Signed)
  Subjective:    Patient ID: Anna Wiggins, female    DOB: 22-Aug-1950, 61 y.o.   MRN: [id number]  HPI Anna Wiggins is a 61 year old, married female, nonsmoker, who comes in today for follow up of hypertension.  We have kept her at bed rest at home and had been adjusting her medication because she developed severe hypertension systolics in the 180 to 200 range.  Today, her blood pressure now is 160/80.  We added Lasix increase the losartin and added a beta-blocker   Review of Systems General and cardiovascular review of systems otherwise negative   Objective:   Physical Exam  Is well-developed, well-nourished, female, in no acute distress.  BP right arm sitting position 160/80.  Pulse 70 and regular      Assessment & Plan:  Hypertension approaching goal.  Plan continue current therapy ambulates it tomorrow if BP okay back to work on Wednesday.  Follow up in one week

## 2011-05-15 ENCOUNTER — Ambulatory Visit: Payer: 59 | Admitting: Family Medicine

## 2011-05-19 ENCOUNTER — Ambulatory Visit: Payer: 59 | Admitting: Family Medicine

## 2011-05-22 ENCOUNTER — Encounter: Payer: Self-pay | Admitting: Family Medicine

## 2011-05-22 ENCOUNTER — Ambulatory Visit (INDEPENDENT_AMBULATORY_CARE_PROVIDER_SITE_OTHER): Payer: 59 | Admitting: Family Medicine

## 2011-05-22 DIAGNOSIS — I10 Essential (primary) hypertension: Secondary | ICD-10-CM

## 2011-05-22 NOTE — Patient Instructions (Signed)
Continue your morning medication  Had a 50 mg olmesartan tablet at bedtime  Blood pressure check daily in the morning  Return in 2 weeks for followup

## 2011-05-22 NOTE — Progress Notes (Signed)
  Subjective:    Patient ID: Anna Wiggins, female    DOB: 15-Sep-1950, 61 y.o.   MRN: [id number]  HPI Anna Wiggins is a 61 year old married female nonsmoker who comes in today for followup hypertension  She's currently on Norvasc 10 mg daily, atenolol 50 mg daily, losartan 100 mg daily, and Lasix 20 mg daily BP today right arm sitting position 160/80 pulse 60 and regular  Her blood pressure is going back towards normal however is not at goal yet.   Review of Systems    general and metabolic and cardiovascular review of systems otherwise negative Objective:   Physical Exam  BP right arm sitting position 160/80 pulse 60 and regular      Assessment & Plan:  Hypertension improved but not yet at goal plan increase the low side and add a 50 mg tablet bedtime BP check daily followup 2 weeks

## 2011-06-09 ENCOUNTER — Ambulatory Visit (INDEPENDENT_AMBULATORY_CARE_PROVIDER_SITE_OTHER): Payer: 59 | Admitting: Family Medicine

## 2011-06-09 ENCOUNTER — Encounter: Payer: Self-pay | Admitting: Family Medicine

## 2011-06-09 DIAGNOSIS — I10 Essential (primary) hypertension: Secondary | ICD-10-CM

## 2011-06-09 NOTE — Patient Instructions (Signed)
Continue your current medication  Check your blood pressure daily in the morning  Return in 6 weeks for followup  When you return. Notebook with all your blood pressure readings and the device

## 2011-06-09 NOTE — Progress Notes (Signed)
  Subjective:    Patient ID: Rayshawn Visconti, female    DOB: November 09, 1950, 61 y.o.   MRN: [id number]  HPI,Analea is a 61 year old married female nonsmoker who comes in today for followup of hypertension  We have been working with her over the last couple weeks because her blood pressure suddenly jumped up in the 200 range. Now on Norvasc 10 mg daily, Tenormin 50 daily, Lasix 20 daily, losartan 100 mg daily and one potassium supplement her blood pressures dropped back to normal 134/70 today  Also blood sugar normal at 64 yesterday   Review of Systems General and cardiovascular review of systems otherwise negative    Objective:   Physical Exam Well-developed well-nourished female in no acute distress BP right arm sitting position 134/70       Assessment & Plan:  Hypertension at goal continue current therapy BP check daily return in 6 weeks for followup

## 2011-07-21 ENCOUNTER — Ambulatory Visit (INDEPENDENT_AMBULATORY_CARE_PROVIDER_SITE_OTHER): Payer: 59 | Admitting: Family Medicine

## 2011-07-21 ENCOUNTER — Encounter: Payer: Self-pay | Admitting: Family Medicine

## 2011-07-21 VITALS — BP 138/70 | Temp 98.3°F | Wt 179.0 lb

## 2011-07-21 DIAGNOSIS — I10 Essential (primary) hypertension: Secondary | ICD-10-CM

## 2011-07-21 DIAGNOSIS — E109 Type 1 diabetes mellitus without complications: Secondary | ICD-10-CM

## 2011-07-21 NOTE — Patient Instructions (Signed)
Continue your current medications  Followup in June for your annual physical

## 2011-07-21 NOTE — Progress Notes (Signed)
  Subjective:    Patient ID: Anna Wiggins, female    DOB: 1950/04/29, 61 y.o.   MRN: [id number]  HPI Anna Wiggins is a 61 year old married female nonsmoker who comes in today for followup of hypertension  She's currently taking Norvasc 10 mg daily, Tenormin 50 mg daily, Lasix 20 mg daily, Cozaar 100 mg daily and a potassium supplement 20 mEq daily. BP 130/70 blood pressures at home all normal  She states her blood sugars are between 70 and 100 fasting. She is on glipizide 10 mg twice a day metformin 500 mg twice a day insulin 15 units each bedtime 7030 last A1c was 7 physical is set for June  She's going to [place] Review of Systems     general and metabolic review of systems otherwise negativeObjective:   Physical Exam  Well-developed well-nourished female in acute distress BP 1:30 of 70. She has a cystic lesion on her right index finger small observe      Assessment & Plan:   hypertension at goal  Diabetes at goal  Cystic lesion right index finger observe

## 2011-10-07 ENCOUNTER — Other Ambulatory Visit (INDEPENDENT_AMBULATORY_CARE_PROVIDER_SITE_OTHER): Payer: 59

## 2011-10-07 DIAGNOSIS — Z0000 Encounter for general adult medical examination without abnormal findings: Secondary | ICD-10-CM

## 2011-10-07 LAB — MICROALBUMIN / CREATININE URINE RATIO
Creatinine,U: 129 mg/dL
Microalb, Ur: 19 mg/dL — ABNORMAL HIGH (ref 0.0–1.9)

## 2011-10-07 LAB — POCT URINALYSIS DIPSTICK
Bilirubin, UA: NEGATIVE
Glucose, UA: NEGATIVE
Leukocytes, UA: NEGATIVE
Nitrite, UA: NEGATIVE
Urobilinogen, UA: 0.2
pH, UA: 7

## 2011-10-07 LAB — HEPATIC FUNCTION PANEL
ALT: 24 U/L (ref 0–35)
AST: 20 U/L (ref 0–37)
Alkaline Phosphatase: 84 U/L (ref 39–117)
Bilirubin, Direct: 0.1 mg/dL (ref 0.0–0.3)
Total Bilirubin: 0.6 mg/dL (ref 0.3–1.2)
Total Protein: 7.5 g/dL (ref 6.0–8.3)

## 2011-10-07 LAB — LIPID PANEL
LDL Cholesterol: 43 mg/dL (ref 0–99)
Total CHOL/HDL Ratio: 2
Triglycerides: 69 mg/dL (ref 0.0–149.0)

## 2011-10-07 LAB — CBC WITH DIFFERENTIAL/PLATELET
Basophils Relative: 0.5 % (ref 0.0–3.0)
Eosinophils Relative: 2.1 % (ref 0.0–5.0)
Lymphocytes Relative: 35.4 % (ref 12.0–46.0)
MCV: 71.3 fl — ABNORMAL LOW (ref 78.0–100.0)
Monocytes Relative: 7.6 % (ref 3.0–12.0)
Neutrophils Relative %: 54.4 % (ref 43.0–77.0)
Platelets: 160 10*3/uL (ref 150.0–400.0)
RBC: 5.28 Mil/uL — ABNORMAL HIGH (ref 3.87–5.11)
WBC: 5.6 10*3/uL (ref 4.5–10.5)

## 2011-10-07 LAB — BASIC METABOLIC PANEL
BUN: 14 mg/dL (ref 6–23)
Calcium: 9.1 mg/dL (ref 8.4–10.5)
Chloride: 107 mEq/L (ref 96–112)
Creatinine, Ser: 0.7 mg/dL (ref 0.4–1.2)
GFR: 117.56 mL/min (ref >60.00)

## 2011-10-20 ENCOUNTER — Ambulatory Visit (INDEPENDENT_AMBULATORY_CARE_PROVIDER_SITE_OTHER): Payer: 59 | Admitting: Family Medicine

## 2011-10-20 ENCOUNTER — Encounter: Payer: Self-pay | Admitting: Family Medicine

## 2011-10-20 VITALS — BP 128/80 | Temp 98.4°F | Ht 66.0 in | Wt 176.0 lb

## 2011-10-20 DIAGNOSIS — E109 Type 1 diabetes mellitus without complications: Secondary | ICD-10-CM

## 2011-10-20 DIAGNOSIS — I10 Essential (primary) hypertension: Secondary | ICD-10-CM

## 2011-10-20 DIAGNOSIS — K219 Gastro-esophageal reflux disease without esophagitis: Secondary | ICD-10-CM

## 2011-10-20 DIAGNOSIS — E785 Hyperlipidemia, unspecified: Secondary | ICD-10-CM

## 2011-10-20 DIAGNOSIS — J309 Allergic rhinitis, unspecified: Secondary | ICD-10-CM

## 2011-10-20 MED ORDER — METFORMIN HCL 500 MG PO TABS: 500.0000 mg | ORAL_TABLET | Freq: Two times a day (BID) | ORAL | Status: DC

## 2011-10-20 MED ORDER — ATENOLOL 50 MG PO TABS: 50.0000 mg | ORAL_TABLET | Freq: Every day | ORAL | Status: DC

## 2011-10-20 MED ORDER — INSULIN ASPART PROT & ASPART (70-30 MIX) 100 UNIT/ML ~~LOC~~ SUSP: SUBCUTANEOUS | Status: DC

## 2011-10-20 MED ORDER — GLIPIZIDE ER 10 MG PO TB24: 10.0000 mg | ORAL_TABLET | Freq: Two times a day (BID) | ORAL | Status: DC

## 2011-10-20 MED ORDER — LOSARTAN POTASSIUM 100 MG PO TABS: 100.0000 mg | ORAL_TABLET | Freq: Every day | ORAL | Status: DC

## 2011-10-20 MED ORDER — "INSULIN SYRINGE-NEEDLE U-100 30G X 1/2"" 0.3 ML MISC": 1.0000 | Status: DC

## 2011-10-20 MED ORDER — SIMVASTATIN 40 MG PO TABS: 40.0000 mg | ORAL_TABLET | Freq: Every day | ORAL | Status: DC

## 2011-10-20 MED ORDER — AMLODIPINE BESYLATE 10 MG PO TABS: 10.0000 mg | ORAL_TABLET | Freq: Every day | ORAL | Status: DC

## 2011-10-20 MED ORDER — POTASSIUM CHLORIDE CRYS ER 20 MEQ PO TBCR: 20.0000 meq | EXTENDED_RELEASE_TABLET | Freq: Every day | ORAL | Status: DC

## 2011-10-20 MED ORDER — FUROSEMIDE 20 MG PO TABS: 20.0000 mg | ORAL_TABLET | Freq: Every day | ORAL | Status: DC

## 2011-10-20 MED ORDER — GLUCOSE BLOOD VI STRP: 1.0000 | ORAL_STRIP | Freq: Every day | Status: DC

## 2011-10-20 NOTE — Patient Instructions (Signed)
Continue your current medications  Restart your walking program 15 minutes daily  Followup in 3 months labs one week prior

## 2011-10-20 NOTE — Progress Notes (Signed)
  Subjective:    Patient ID: Anna Wiggins, female    DOB: [birth year], 61 y.o.   MRN: [id number]  HPI Anna Wiggins is a 61 year old married female nonsmoker who comes in today for general physical examination because of a history of hypertension, diabetes insulin-dependent, hyperlipidemia  Her medications reviewed in detail and there've been no changes. Blood sugars are in the 110 range A1c has dropped to 7.05 months ago now up to 7.5. She states she's not walking on a regular basis.  She gets routine eye care, dental care, BSE monthly, recent followup mammogram showed calcifications otherwise no change, colonoscopy and GI, tetanus 2005, Pneumovax x1 2006, information given on shingles   Review of Systems  Constitutional: Negative.   HENT: Negative.   Eyes: Negative.   Respiratory: Negative.   Cardiovascular: Negative.   Gastrointestinal: Negative.   Genitourinary: Negative.   Musculoskeletal: Negative.   Neurological: Negative.   Hematological: Negative.   Psychiatric/Behavioral: Negative.        Objective:   Physical Exam  Constitutional: She appears well-developed and well-nourished.  HENT:  Head: Normocephalic and atraumatic.  Right Ear: External ear normal.  Left Ear: External ear normal.  Nose: Nose normal.  Mouth/Throat: Oropharynx is clear and moist.  Eyes: EOM are normal. Pupils are equal, round, and reactive to light.  Neck: Normal range of motion. Neck supple. No thyromegaly present.  Cardiovascular: Normal rate, regular rhythm, normal heart sounds and intact distal pulses.  Exam reveals no gallop and no friction rub.   No murmur heard. Pulmonary/Chest: Effort normal and breath sounds normal.  Abdominal: Soft. Bowel sounds are normal. She exhibits no distension and no mass. There is no tenderness. There is no rebound.  Genitourinary:         Bilateral breast exam normal  Uterus was removed many years ago for nonmalignant reasons ovaries were left intact  asymptomatic therefore pelvic exam not indicated  Musculoskeletal: Normal range of motion.  Lymphadenopathy:    She has no cervical adenopathy.  Neurological: She is alert. She has normal reflexes. No cranial nerve deficit. She exhibits normal muscle tone. Coordination normal.  Skin: Skin is warm and dry.  Psychiatric: She has a normal mood and affect. Her behavior is normal. Judgment and thought content normal.          Assessment & Plan:  Healthy female  Diabetes type 1 not at goal add walking program followup A1c in 3 months  Hypertension at goal continue current therapy  Lipids at goal with an LDL of 43 continue current therapy  Protein in urine secondary to diabetes type 1 again tighten up on diabetes control

## 2011-10-24 ENCOUNTER — Other Ambulatory Visit: Payer: Self-pay | Admitting: *Deleted

## 2011-10-24 MED ORDER — "INSULIN SYRINGE-NEEDLE U-100 30G X 1/2"" 0.3 ML MISC": 1.0000 | Freq: Two times a day (BID) | Status: AC

## 2011-11-08 ENCOUNTER — Other Ambulatory Visit: Payer: Self-pay | Admitting: Family Medicine

## 2011-12-22 ENCOUNTER — Telehealth: Payer: Self-pay | Admitting: Family Medicine

## 2011-12-22 DIAGNOSIS — E109 Type 1 diabetes mellitus without complications: Secondary | ICD-10-CM

## 2011-12-22 MED ORDER — GLIPIZIDE ER 10 MG PO TB24: 10.0000 mg | ORAL_TABLET | Freq: Two times a day (BID) | ORAL | Status: DC

## 2011-12-22 MED ORDER — METFORMIN HCL 500 MG PO TABS: 500.0000 mg | ORAL_TABLET | Freq: Two times a day (BID) | ORAL | Status: DC

## 2011-12-22 NOTE — Telephone Encounter (Signed)
Rx sent 

## 2011-12-22 NOTE — Telephone Encounter (Signed)
Patient called stating that her rxs should always go through [facility] as the local pharmacy [facility] on [address] will only give her generic. Patient need a refill of her metformin and glucophage called into [facility]. Please assist.

## 2012-01-20 ENCOUNTER — Other Ambulatory Visit (INDEPENDENT_AMBULATORY_CARE_PROVIDER_SITE_OTHER): Payer: 59

## 2012-01-20 DIAGNOSIS — E109 Type 1 diabetes mellitus without complications: Secondary | ICD-10-CM

## 2012-01-20 LAB — BASIC METABOLIC PANEL
Calcium: 8.7 mg/dL (ref 8.4–10.5)
GFR: 92.73 mL/min (ref >60.00)
Glucose, Bld: 125 mg/dL — ABNORMAL HIGH (ref 70–99)
Potassium: 4.7 mEq/L (ref 3.5–5.1)
Sodium: 139 mEq/L (ref 135–145)

## 2012-01-20 LAB — MICROALBUMIN / CREATININE URINE RATIO
Microalb Creat Ratio: 9.5 mg/g (ref 0.0–30.0)
Microalb, Ur: 2.7 mg/dL — ABNORMAL HIGH (ref 0.0–1.9)

## 2012-01-20 LAB — HEMOGLOBIN A1C: Hgb A1c MFr Bld: 7.6 % — ABNORMAL HIGH (ref 4.6–6.5)

## 2012-01-27 ENCOUNTER — Encounter: Payer: Self-pay | Admitting: Family Medicine

## 2012-01-27 ENCOUNTER — Ambulatory Visit (INDEPENDENT_AMBULATORY_CARE_PROVIDER_SITE_OTHER): Payer: 59 | Admitting: Family Medicine

## 2012-01-27 VITALS — BP 150/80 | Temp 98.0°F | Wt 181.0 lb

## 2012-01-27 DIAGNOSIS — I10 Essential (primary) hypertension: Secondary | ICD-10-CM

## 2012-01-27 DIAGNOSIS — E109 Type 1 diabetes mellitus without complications: Secondary | ICD-10-CM

## 2012-01-27 MED ORDER — HYDROCHLOROTHIAZIDE 25 MG PO TABS: 25.0000 mg | ORAL_TABLET | Freq: Every day | ORAL | Status: DC

## 2012-01-27 NOTE — Patient Instructions (Addendum)
Increase your dose of insulin to 35 units and take it before your evening meal  Check a fasting blood sugar daily in the morning  Return in one month for followup  Prerequisite of all your blood sugar readings with you  If you have any questions call and leave a voice mail for [facility]

## 2012-01-27 NOTE — Progress Notes (Signed)
  Subjective:    Patient ID: Anna Wiggins, female    DOB: Jan 21, 1951, 61 y.o.   MRN: [id number]  HPI Anna Wiggins is a 61 year old female who comes in today for followup of diabetes type 1  He's on Glucotrol 10 mg twice a day metformin 500 mg twice a day and 15 units of insulin which she splits twice daily. Total dose 30 units. A1c still elevated 7.6% with a random fasting blood sugar 125. No hypoglycemia   Review of Systems General and metabolic review of systems otherwise negative    Objective:   Physical Exam  Well-developed well-nourished female no acute distress      Assessment & Plan:  Diabetes type 1 not at goal discussed various options we'll increase her total dose to 35 units daily give it once a day before evening meal and increase her insulin by 5 units every 2 weeks until blood sugar drops to 100 followup in one month

## 2012-02-20 ENCOUNTER — Encounter: Payer: Self-pay | Admitting: Family Medicine

## 2012-03-01 ENCOUNTER — Encounter: Payer: Self-pay | Admitting: Family Medicine

## 2012-03-01 ENCOUNTER — Ambulatory Visit (INDEPENDENT_AMBULATORY_CARE_PROVIDER_SITE_OTHER): Payer: 59 | Admitting: Family Medicine

## 2012-03-01 VITALS — BP 130/80 | Temp 98.4°F | Wt 182.0 lb

## 2012-03-01 DIAGNOSIS — E109 Type 1 diabetes mellitus without complications: Secondary | ICD-10-CM

## 2012-03-01 NOTE — Progress Notes (Signed)
  Subjective:    Patient ID: Anna Wiggins, female    DOB: February 12, 1951, 61 y.o.   MRN: [id number]  HPI Doy Hutching is a 61 year old female type I diabetic who comes in today for followup  She's been working hard the last 3 months to get her blood sugars back to normal. Blood sugar now 125 fasting with an A1c of 7.6%. However her microalbumin has dropped from 19-2. And she brings in her blood sugars in the morning are ranging from 80-110.  We discussed getting to goal however in her situation with her sugars being well and historically once or twice a week she does have some episodes of hypoglycemia usually in the middle of the night content to leave her blood sugar were it is in her medications were it is.    Review of Systems    general and metabolic review of systems otherwise negative blood pressure normal Objective:   Physical Exam Well-developed well-nourished female no acute distress       Assessment & Plan:  Diabetes type 1 at goal continue current therapy #2 hypertension at goal continue current therapy

## 2012-03-01 NOTE — Patient Instructions (Signed)
Continue your current treatment program  Followup in June for your annual exam sooner if any problems  Nonfasting labs one week prior

## 2012-03-16 ENCOUNTER — Other Ambulatory Visit: Payer: Self-pay | Admitting: *Deleted

## 2012-03-16 DIAGNOSIS — I10 Essential (primary) hypertension: Secondary | ICD-10-CM

## 2012-03-16 MED ORDER — HYDROCHLOROTHIAZIDE 25 MG PO TABS: 25.0000 mg | ORAL_TABLET | Freq: Every day | ORAL | Status: DC

## 2012-03-24 ENCOUNTER — Other Ambulatory Visit: Payer: Self-pay | Admitting: Family Medicine

## 2012-04-08 ENCOUNTER — Other Ambulatory Visit: Payer: Self-pay | Admitting: Family Medicine

## 2012-04-15 ENCOUNTER — Other Ambulatory Visit: Payer: Self-pay | Admitting: Family Medicine

## 2012-06-09 ENCOUNTER — Telehealth: Payer: Self-pay | Admitting: Family Medicine

## 2012-06-09 NOTE — Telephone Encounter (Signed)
Patient Information:  Caller Name: Tamantha  Phone: [phone]  Patient: Anna Wiggins, Anna Wiggins  Gender: Female  DOB: 23-Sep-1950  Age: 62 Years  PCP: Kelle Darting [facility])  Office Follow Up:  Does the office need to follow up with this patient?: No  Instructions For The Office: N/A   Symptoms  Reason For Call & Symptoms: c/o high blood sugar; says it was low in December; then she noticed it started to rise in January 114-142; says she was eating an orange daily, but she stopped; today it was 180 at 1030 fasting;  Reviewed Health History In EMR: Yes  Reviewed Medications In EMR: Yes  Reviewed Allergies In EMR: Yes  Reviewed Surgeries / Procedures: Yes  Date of Onset of Symptoms: Unknown  Guideline(s) Used:  Diabetes - High Blood Sugar  Disposition Per Guideline:   Home Care  Reason For Disposition Reached:   Blood glucose 60-240 mg/dl (3.5 -13 mmol/l)  Advice Given:  General  Definition of hyperglycemia: - Fasting blood glucose more than 140 mg/dL (7.5 mmol/l) or random blood glucose more than 200 mg/dL (11 mmol/l).  General  Definition of hyperglycemia: - Fasting blood glucose more than 140 mg/dL (7.5 mmol/l) or random blood glucose more than 200 mg/dL (11 mmol/l).  Symptoms of mild hyperglycemia: frequent urination, increased thirst, fatigue, blurred vision.  Treatment - Liquids  Drink at least one glass (8 oz or 240 ml) of water per hour for the next 4 hours. (Reason: adequate hydration will reduce hyperglycemia).  Generally, you should try to drink 6-8 glasses of water each day.  Measure and Record Your Blood Glucose  Every day you should measure your blood glucose before breakfast and before going to bed.  Record the results and show them to your doctor at your next office visit.  Treatment - Insulin  Continue to take your insulin, as prescribed by your doctor.  Treatment - Diabetes Medications  : Continue taking your diabetes pills.  Daily Blood Glucose Goals  Pre-prandial (before meal): 70-130 mg/dL (6.5-7.8 mmol/l)  Post-prandial (2-3 hours after a meal): Less than 180 mg/dL (10 mmol/l)  Expected Course  Your blood sugar continues to get above 240 mg/dl (13 mmol/l).  Call Back If:  You become worse.

## 2012-06-09 NOTE — Telephone Encounter (Signed)
For your review.  Pt given instructions by CAN

## 2012-06-10 NOTE — Telephone Encounter (Signed)
noted 

## 2012-06-15 ENCOUNTER — Other Ambulatory Visit: Payer: Self-pay | Admitting: *Deleted

## 2012-06-15 MED ORDER — INSULIN ASPART PROT & ASPART (70-30 MIX) 100 UNIT/ML ~~LOC~~ SUSP: 35.0000 [IU] | Freq: Every day | SUBCUTANEOUS | Status: DC

## 2012-07-01 ENCOUNTER — Telehealth: Payer: Self-pay | Admitting: Family Medicine

## 2012-07-01 MED ORDER — INSULIN ASPART PROT & ASPART (70-30 MIX) 100 UNIT/ML ~~LOC~~ SUSP: SUBCUTANEOUS | Status: DC

## 2012-07-01 NOTE — Telephone Encounter (Signed)
Pt called and stated that [facility] mail service has yet to fill her Novolog 70/30 RX, due to a flaw in the instructions. The RX was called in on 06/15/12, but will not be filled until the mail service hears from a nurse/doctor. The telephone number to reach them is [phone], and the reference number for this case is [id number]. Please advise.

## 2012-10-05 ENCOUNTER — Other Ambulatory Visit: Payer: Self-pay | Admitting: *Deleted

## 2012-10-05 DIAGNOSIS — I10 Essential (primary) hypertension: Secondary | ICD-10-CM

## 2012-10-05 MED ORDER — ATENOLOL 50 MG PO TABS: 50.0000 mg | ORAL_TABLET | Freq: Every day | ORAL | Status: DC

## 2012-10-11 ENCOUNTER — Other Ambulatory Visit: Payer: Self-pay | Admitting: Family Medicine

## 2012-10-12 ENCOUNTER — Other Ambulatory Visit (INDEPENDENT_AMBULATORY_CARE_PROVIDER_SITE_OTHER): Payer: 59

## 2012-10-12 DIAGNOSIS — E109 Type 1 diabetes mellitus without complications: Secondary | ICD-10-CM

## 2012-10-12 LAB — MICROALBUMIN / CREATININE URINE RATIO
Creatinine,U: 105 mg/dL
Microalb, Ur: 8.7 mg/dL — ABNORMAL HIGH (ref 0.0–1.9)

## 2012-10-12 LAB — POCT URINALYSIS DIPSTICK
Bilirubin, UA: NEGATIVE
Ketones, UA: NEGATIVE
Leukocytes, UA: NEGATIVE
Nitrite, UA: NEGATIVE
pH, UA: 6

## 2012-10-12 LAB — HEPATIC FUNCTION PANEL
AST: 19 U/L (ref 0–37)
Alkaline Phosphatase: 60 U/L (ref 39–117)
Bilirubin, Direct: 0.1 mg/dL (ref 0.0–0.3)
Total Bilirubin: 0.5 mg/dL (ref 0.3–1.2)

## 2012-10-12 LAB — CBC WITH DIFFERENTIAL/PLATELET
Basophils Absolute: 0 10*3/uL (ref 0.0–0.1)
HCT: 36.7 % (ref 36.0–46.0)
Hemoglobin: 11.7 g/dL — ABNORMAL LOW (ref 12.0–15.0)
Lymphs Abs: 1.9 10*3/uL (ref 0.7–4.0)
MCV: 71.5 fl — ABNORMAL LOW (ref 78.0–100.0)
Monocytes Absolute: 0.4 10*3/uL (ref 0.1–1.0)
Monocytes Relative: 7.2 % (ref 3.0–12.0)
Neutro Abs: 3.5 10*3/uL (ref 1.4–7.7)
Platelets: 191 10*3/uL (ref 150.0–400.0)
RDW: 16 % — ABNORMAL HIGH (ref 11.5–14.6)

## 2012-10-12 LAB — BASIC METABOLIC PANEL
BUN: 12 mg/dL (ref 6–23)
Chloride: 105 mEq/L (ref 96–112)
GFR: 96.62 mL/min (ref >60.00)
Potassium: 3.9 mEq/L (ref 3.5–5.1)
Sodium: 139 mEq/L (ref 135–145)

## 2012-10-12 LAB — LIPID PANEL: VLDL: 14.6 mg/dL (ref 0.0–40.0)

## 2012-10-20 ENCOUNTER — Other Ambulatory Visit: Payer: Self-pay | Admitting: Family Medicine

## 2012-10-20 ENCOUNTER — Ambulatory Visit (INDEPENDENT_AMBULATORY_CARE_PROVIDER_SITE_OTHER): Payer: 59 | Admitting: Family Medicine

## 2012-10-20 ENCOUNTER — Encounter: Payer: Self-pay | Admitting: Family Medicine

## 2012-10-20 VITALS — BP 140/80 | Temp 98.5°F | Ht 68.75 in | Wt 186.0 lb

## 2012-10-20 DIAGNOSIS — I10 Essential (primary) hypertension: Secondary | ICD-10-CM

## 2012-10-20 DIAGNOSIS — J309 Allergic rhinitis, unspecified: Secondary | ICD-10-CM

## 2012-10-20 DIAGNOSIS — E785 Hyperlipidemia, unspecified: Secondary | ICD-10-CM

## 2012-10-20 DIAGNOSIS — E109 Type 1 diabetes mellitus without complications: Secondary | ICD-10-CM

## 2012-10-20 MED ORDER — HYDROCHLOROTHIAZIDE 25 MG PO TABS: 25.0000 mg | ORAL_TABLET | Freq: Every day | ORAL | Status: DC

## 2012-10-20 MED ORDER — GLIPIZIDE ER 10 MG PO TB24: 10.0000 mg | ORAL_TABLET | Freq: Two times a day (BID) | ORAL | Status: DC

## 2012-10-20 MED ORDER — METFORMIN HCL 500 MG PO TABS: 500.0000 mg | ORAL_TABLET | Freq: Two times a day (BID) | ORAL | Status: DC

## 2012-10-20 MED ORDER — AMLODIPINE BESYLATE 10 MG PO TABS: ORAL_TABLET | ORAL | Status: DC

## 2012-10-20 MED ORDER — ATENOLOL 50 MG PO TABS: ORAL_TABLET | ORAL | Status: DC

## 2012-10-20 MED ORDER — POTASSIUM CHLORIDE CRYS ER 20 MEQ PO TBCR: EXTENDED_RELEASE_TABLET | ORAL | Status: DC

## 2012-10-20 MED ORDER — SIMVASTATIN 40 MG PO TABS: ORAL_TABLET | ORAL | Status: DC

## 2012-10-20 MED ORDER — INSULIN ASPART PROT & ASPART (70-30 MIX) 100 UNIT/ML ~~LOC~~ SUSP: SUBCUTANEOUS | Status: DC

## 2012-10-20 MED ORDER — LOSARTAN POTASSIUM 100 MG PO TABS: ORAL_TABLET | ORAL | Status: DC

## 2012-10-20 NOTE — Patient Instructions (Addendum)
Continue your current medications  Followup in 3 months  Nonfasting labs one week prior  If you have a very active day like last Saturday he might want to decrease her nightly insulin dose from 30-20 sig you don't develop hypoglycemia in the middle the night

## 2012-10-20 NOTE — Progress Notes (Signed)
  Subjective:   Patient ID: Anna Wiggins, femal   DOB: 02/10/1951, 62 y.o.   MRN: [id number]  HPI [name] is a 62 year old married female nonsmoker who comes in today for her annual physical examination because of a history of diabetes type 1, hypertension, hyperlipidemia,  She says overall she feels well she had one episode last Saturday at 3 AM where she woke up shaky and nervous. She checked her blood sugar was 28. She asymptomatic when it came up. She's had no other episodes of hypoglycemia. She's not recall anything different she did that day. She states her diet exercise and medication were unchanged.  Hemoglobin A1c 7.7.  She gets routine eye care, dental care, BSE monthly, and you mammography,,,,,,,,, mammogram in November showed some calcifications followup studies normal  Colonoscopy and GI, pelvic exam by Dr. Cherly Hensen. She to reduce removed at age 62 for cervical cancer. Ovaries were left intact.She is taking various herbal and vitamin products,. Cousin doesn't annual Pap and pelvic  Vaccinations up-to-date    Review of Systems  Constitutional: Negative.   HENT: Negative.   Eyes: Negative.   Respiratory: Negative.   Cardiovascular: Negative.   Gastrointestinal: Negative.   Genitourinary: Negative.   Musculoskeletal: Negative.   Neurological: Negative.   Psychiatric/Behavioral: Negative.      Objective:  Physical Exam  Nursing note and vitals reviewed. Constitutional: She is oriented to person, place, and time. She appears well-developed and well-nourished.  HENT:  Head: Normocephalic and atraumatic.  Right Ear: External ear normal.  Left Ear: External ear normal.  Nose: Nose normal.  Mouth/Throat: Oropharynx is clear and moist.  Eyes: EOM are normal. Pupils are equal, round, and reactive to light.  Neck: Normal range of motion. Neck supple. No thyromegaly present.  Cardiovascular: Normal rate, regular rhythm, normal heart sounds and intact distal pulses.  Exam  reveals no gallop and no friction rub.   No murmur heard. No carotid nor aortic bruits,,, peripheral pulses 1+ out of 2 and symmetrical  Pulmonary/Chest: Effort normal and breath sounds normal.  Abdominal: Soft. Bowel sounds are normal. She exhibits no distension and no mass. There is no tenderness. There is no rebound.  Genitourinary:  Bilateral breast exam normal  Musculoskeletal: Normal range of motion. She exhibits no edema and no tenderness.  Lymphadenopathy:    She has no cervical adenopathy.  Neurological: She is alert and oriented to person, place, and time. She has normal reflexes. No cranial nerve deficit. She exhibits normal muscle tone. Coordination normal.  Normal neurologic exam,,,,,  Skin: Skin is warm and dry.  Psychiatric: She has a normal mood and affect. Her behavior is normal. Judgment and thought content normal.        Assessment & Plan:  Healthy female  Type 1 diabetes continue current therapy  Hypertension continue current therapy  Hyperlipidemia continue current therap  Hysterectomy at age 62 for cervical cancer continue GYN followup

## 2012-11-17 ENCOUNTER — Telehealth: Payer: Self-pay | Admitting: Family Medicine

## 2012-11-17 NOTE — Telephone Encounter (Signed)
Pt needs a new script for generic for noveline.( Instead of novolog) [facility]

## 2012-11-18 NOTE — Telephone Encounter (Signed)
Left message on machine for patient to return our call 

## 2012-11-18 NOTE — Telephone Encounter (Signed)
Spoke with patient and explained that the generic is not as fast acting as the name brand.  Please advise

## 2012-11-19 MED ORDER — INSULIN NPH ISOPHANE & REGULAR (70-30) 100 UNIT/ML ~~LOC~~ SUSP: 30.0000 [IU] | Freq: Two times a day (BID) | SUBCUTANEOUS | Status: DC

## 2012-11-19 NOTE — Telephone Encounter (Signed)
Okay per Dr Tawanna Cooler.  New Rx sent. Left message on machine for patient.

## 2013-01-04 ENCOUNTER — Other Ambulatory Visit: Payer: Self-pay | Admitting: Family Medicine

## 2013-01-13 ENCOUNTER — Other Ambulatory Visit (INDEPENDENT_AMBULATORY_CARE_PROVIDER_SITE_OTHER): Payer: 59

## 2013-01-13 DIAGNOSIS — E109 Type 1 diabetes mellitus without complications: Secondary | ICD-10-CM

## 2013-01-13 LAB — BASIC METABOLIC PANEL
BUN: 14 mg/dL (ref 6–23)
CO2: 27 mEq/L (ref 19–32)
Chloride: 101 mEq/L (ref 96–112)
Creatinine, Ser: 0.7 mg/dL (ref 0.4–1.2)
Glucose, Bld: 230 mg/dL — ABNORMAL HIGH (ref 70–99)
Potassium: 3.1 mEq/L — ABNORMAL LOW (ref 3.5–5.1)

## 2013-01-13 LAB — HEMOGLOBIN A1C: Hgb A1c MFr Bld: 7.9 % — ABNORMAL HIGH (ref 4.6–6.5)

## 2013-01-20 ENCOUNTER — Ambulatory Visit (INDEPENDENT_AMBULATORY_CARE_PROVIDER_SITE_OTHER): Payer: 59 | Admitting: Family Medicine

## 2013-01-20 ENCOUNTER — Encounter: Payer: Self-pay | Admitting: Family Medicine

## 2013-01-20 VITALS — BP 140/80 | Temp 98.0°F | Wt 187.0 lb

## 2013-01-20 DIAGNOSIS — Z23 Encounter for immunization: Secondary | ICD-10-CM

## 2013-01-20 DIAGNOSIS — E109 Type 1 diabetes mellitus without complications: Secondary | ICD-10-CM

## 2013-01-20 NOTE — Patient Instructions (Signed)
Continue your insulin dose and oral meds  Walk 30 minutes daily  If in a month he still C. elevated blood sugars increase your insulin to 35 units twice daily  Followup the first week in July for your annual exam  Labs one week prior nonfasting

## 2013-01-20 NOTE — Progress Notes (Signed)
  Subjective:    Patient ID: Anna Wiggins, female    DOB: [birth year], 62 y.o.   MRN: [id number]  HPI Anna Wiggins is a 62 year old female nonsmoker who comes in today for followup of diabetes type 1  She's currently on 30 units of insulin twice daily along with metformin 500 mg twice a day and Glucotrol 10 mg twice a day. Her fasting sugar the other day was 2:30 but she said she had a big meal. She was very compliant with her diet yesterday and her fasting blood sugar this morning was 65  She walks about 3 days a week.  Hemoglobin A1c up to 7.9%  We discussed various options which include change in.increasing exercise were increase the insulin. She likes to walk 30 minutes daily in lieu of increasing her insulin.   Review of Systems    review of systems negative Objective:   Physical Exam  Well-developed well-nourished female no acute distress vital signs stable she's afebrile foot exam normal skin normal pulses no neuropathy      Assessment & Plan:  Diabetes type 1 increase exercise to 30 minutes daily monitor blood sugar followup in the spring

## 2013-07-07 ENCOUNTER — Other Ambulatory Visit: Payer: Self-pay | Admitting: Family Medicine

## 2013-08-30 ENCOUNTER — Other Ambulatory Visit: Payer: Self-pay | Admitting: Family Medicine

## 2013-10-06 ENCOUNTER — Other Ambulatory Visit: Payer: Self-pay | Admitting: Family Medicine

## 2013-10-07 ENCOUNTER — Other Ambulatory Visit: Payer: Self-pay | Admitting: Family Medicine

## 2013-10-13 ENCOUNTER — Other Ambulatory Visit (INDEPENDENT_AMBULATORY_CARE_PROVIDER_SITE_OTHER): Payer: 59

## 2013-10-13 DIAGNOSIS — E109 Type 1 diabetes mellitus without complications: Secondary | ICD-10-CM

## 2013-10-13 DIAGNOSIS — Z23 Encounter for immunization: Secondary | ICD-10-CM

## 2013-10-13 LAB — POCT URINALYSIS DIPSTICK
BILIRUBIN UA: NEGATIVE
Glucose, UA: NEGATIVE
Ketones, UA: NEGATIVE
LEUKOCYTES UA: NEGATIVE
NITRITE UA: NEGATIVE
PH UA: 7
RBC UA: NEGATIVE
Spec Grav, UA: 1.015
UROBILINOGEN UA: 0.2

## 2013-10-13 LAB — HEPATIC FUNCTION PANEL
ALBUMIN: 3.9 g/dL (ref 3.5–5.2)
ALK PHOS: 56 U/L (ref 39–117)
ALT: 25 U/L (ref 0–35)
AST: 22 U/L (ref 0–37)
BILIRUBIN DIRECT: 0.1 mg/dL (ref 0.0–0.3)
Total Bilirubin: 0.5 mg/dL (ref 0.2–1.2)
Total Protein: 7.5 g/dL (ref 6.0–8.3)

## 2013-10-13 LAB — CBC WITH DIFFERENTIAL/PLATELET
BASOS ABS: 0 10*3/uL (ref 0.0–0.1)
Basophils Relative: 0.2 % (ref 0.0–3.0)
EOS ABS: 0.1 10*3/uL (ref 0.0–0.7)
Eosinophils Relative: 2.5 % (ref 0.0–5.0)
HCT: 37.4 % (ref 36.0–46.0)
Hemoglobin: 11.7 g/dL — ABNORMAL LOW (ref 12.0–15.0)
Lymphocytes Relative: 29.8 % (ref 12.0–46.0)
Lymphs Abs: 1.7 10*3/uL (ref 0.7–4.0)
MCHC: 31.4 g/dL (ref 30.0–36.0)
MCV: 71.3 fl — ABNORMAL LOW (ref 78.0–100.0)
MONO ABS: 0.5 10*3/uL (ref 0.1–1.0)
MONOS PCT: 8.3 % (ref 3.0–12.0)
NEUTROS ABS: 3.4 10*3/uL (ref 1.4–7.7)
Neutrophils Relative %: 59.2 % (ref 43.0–77.0)
PLATELETS: 206 10*3/uL (ref 150.0–400.0)
RBC: 5.25 Mil/uL — ABNORMAL HIGH (ref 3.87–5.11)
RDW: 15.6 % — AB (ref 11.5–15.5)
WBC: 5.7 10*3/uL (ref 4.0–10.5)

## 2013-10-13 LAB — LIPID PANEL
Cholesterol: 92 mg/dL (ref 0–200)
HDL: 36.6 mg/dL — AB (ref >39.00)
LDL Cholesterol: 41 mg/dL (ref 0–99)
NONHDL: 55.4
TRIGLYCERIDES: 74 mg/dL (ref 0.0–149.0)
Total CHOL/HDL Ratio: 3
VLDL: 14.8 mg/dL (ref 0.0–40.0)

## 2013-10-13 LAB — BASIC METABOLIC PANEL
BUN: 10 mg/dL (ref 6–23)
CALCIUM: 9.1 mg/dL (ref 8.4–10.5)
CO2: 29 meq/L (ref 19–32)
CREATININE: 0.8 mg/dL (ref 0.4–1.2)
Chloride: 104 mEq/L (ref 96–112)
GFR: 100.75 mL/min (ref >60.00)
Glucose, Bld: 131 mg/dL — ABNORMAL HIGH (ref 70–99)
Potassium: 3.7 mEq/L (ref 3.5–5.1)
SODIUM: 141 meq/L (ref 135–145)

## 2013-10-13 LAB — MICROALBUMIN / CREATININE URINE RATIO
CREATININE, U: 86.6 mg/dL
MICROALB UR: 4.4 mg/dL — AB (ref 0.0–1.9)
Microalb Creat Ratio: 5.1 mg/g (ref 0.0–30.0)

## 2013-10-13 LAB — TSH: TSH: 1.59 u[IU]/mL (ref 0.35–4.50)

## 2013-10-13 LAB — HEMOGLOBIN A1C: Hgb A1c MFr Bld: 7.7 % — ABNORMAL HIGH (ref 4.6–6.5)

## 2013-10-20 ENCOUNTER — Ambulatory Visit (INDEPENDENT_AMBULATORY_CARE_PROVIDER_SITE_OTHER): Payer: 59 | Admitting: Family Medicine

## 2013-10-20 ENCOUNTER — Encounter: Payer: Self-pay | Admitting: Family Medicine

## 2013-10-20 DIAGNOSIS — Z23 Encounter for immunization: Secondary | ICD-10-CM

## 2013-10-20 DIAGNOSIS — I10 Essential (primary) hypertension: Secondary | ICD-10-CM

## 2013-10-20 DIAGNOSIS — E785 Hyperlipidemia, unspecified: Secondary | ICD-10-CM

## 2013-10-20 DIAGNOSIS — Z0000 Encounter for general adult medical examination without abnormal findings: Secondary | ICD-10-CM

## 2013-10-20 DIAGNOSIS — E109 Type 1 diabetes mellitus without complications: Secondary | ICD-10-CM

## 2013-10-20 MED ORDER — AMLODIPINE BESYLATE 10 MG PO TABS: ORAL_TABLET | ORAL | Status: DC

## 2013-10-20 MED ORDER — ATENOLOL 50 MG PO TABS: ORAL_TABLET | ORAL | Status: DC

## 2013-10-20 MED ORDER — POTASSIUM CHLORIDE CRYS ER 20 MEQ PO TBCR: EXTENDED_RELEASE_TABLET | ORAL | Status: DC

## 2013-10-20 MED ORDER — LOSARTAN POTASSIUM 100 MG PO TABS: ORAL_TABLET | ORAL | Status: DC

## 2013-10-20 MED ORDER — GLUCOSE BLOOD VI STRP: ORAL_STRIP | Status: DC

## 2013-10-20 MED ORDER — INSULIN NPH ISOPHANE & REGULAR (70-30) 100 UNIT/ML ~~LOC~~ SUSP: SUBCUTANEOUS | Status: DC

## 2013-10-20 MED ORDER — SIMVASTATIN 40 MG PO TABS: ORAL_TABLET | ORAL | Status: DC

## 2013-10-20 MED ORDER — GLIPIZIDE ER 10 MG PO TB24: ORAL_TABLET | ORAL | Status: DC

## 2013-10-20 MED ORDER — METFORMIN HCL 500 MG PO TABS: ORAL_TABLET | ORAL | Status: DC

## 2013-10-20 MED ORDER — HYDROCHLOROTHIAZIDE 25 MG PO TABS: ORAL_TABLET | ORAL | Status: DC

## 2013-10-20 MED ORDER — "INSULIN SYRINGE-NEEDLE U-100 30G X 1/2"" 1 ML MISC": Status: AC

## 2013-10-20 NOTE — Progress Notes (Signed)
   Subjective:    Patient ID: Marguriete Wootan, female    DOB: [birth year], 63 y.o.   MRN: [id number]  HPI Amaria is a 63 year old married female nonsmoker who comes in today for general physical examination because of a history of hypertension, diabetes type 1, hyperlipidemia  She states she feels well and has no complaints. Her blood sugar and glipizide 10 mg twice a day, metformin 500 twice a day, insulin 70-30 dose 36 units twice daily is in the 1:30 range. Her A1c is 7.7%. It was 7.9% 3 months ago.  She states she walks daily and follows her diet. However she does decrease her insulin dose in the morning if her morning sugars are low. Lowest blood sugar is 80. No hypoglycemia  She gets routine eye care, dental care, BSE monthly, and you mammography, colonoscopy and GI  GYN evaluation yearly because she's had her he uterus removed and she had cervical dysplasia  Vaccinations updated by the nurse   Review of Systems  Constitutional: Negative.   HENT: Negative.   Eyes: Negative.   Respiratory: Negative.   Cardiovascular: Negative.   Gastrointestinal: Negative.   Genitourinary: Negative.   Musculoskeletal: Negative.   Neurological: Negative.   Psychiatric/Behavioral: Negative.        Objective:   Physical Exam  Nursing note and vitals reviewed. Constitutional: She appears well-developed and well-nourished.  HENT:  Head: Normocephalic and atraumatic.  Right Ear: External ear normal.  Left Ear: External ear normal.  Nose: Nose normal.  Mouth/Throat: Oropharynx is clear and moist.  Eyes: EOM are normal. Pupils are equal, round, and reactive to light.  Neck: Normal range of motion. Neck supple. No thyromegaly present.  Cardiovascular: Normal rate, regular rhythm, normal heart sounds and intact distal pulses.  Exam reveals no gallop and no friction rub.   No murmur heard. Pulmonary/Chest: Effort normal and breath sounds normal.  Abdominal: Soft. Bowel sounds are normal. She  exhibits no distension and no mass. There is no tenderness. There is no rebound.  Genitourinary:  Bilateral breast exam normal  Pelvic and Pap by GYN  Musculoskeletal: Normal range of motion.  Lymphadenopathy:    She has no cervical adenopathy.  Neurological: She is alert. She has normal reflexes. No cranial nerve deficit. She exhibits normal muscle tone. Coordination normal.  Skin: Skin is warm and dry.  Psychiatric: She has a normal mood and affect. Her behavior is normal. Judgment and thought content normal.          Assessment & Plan:  Healthy female  Diabetes type 1 not at goal........ instructed her to continue to 36 units twice daily and not decrease her insulin dose because her morning sugars are in the 80s.  Followup in 3 months  Hyperlipidemia goal continue current therapy  Hypertension not at goal,,,,,,,,, BP today 160/70...Marland KitchenMarland KitchenMarland Kitchen she states she had a bad night and didn't sleep but 4 hours........Marland Kitchen we will have her check her blood pressure at home for 2 weeks if not normal return for followup in 2 weeks  History of cervical dysplasia...Marland KitchenMarland Kitchen status post hysterectomy....... followed up by GYN

## 2013-10-20 NOTE — Patient Instructions (Signed)
Take your 36 units of insulin daily along with the oral medications  Check a fasting blood sugar daily in the morning  Return in 3 months for followup  Labs one week prior.........Marland Kitchen 11:30 AM...... or 4:30 PM  Check a blood pressure daily in the morning...........Marland Kitchen blood pressure goal 135/85 or less.....Marland Kitchen if not at goal return in 2 weeks with the data and the device for followup

## 2013-10-21 ENCOUNTER — Telehealth: Payer: Self-pay | Admitting: Family Medicine

## 2013-10-21 NOTE — Telephone Encounter (Signed)
Relevant patient education mailed to patient.  

## 2013-12-28 ENCOUNTER — Encounter: Payer: Self-pay | Admitting: Family Medicine

## 2014-01-16 ENCOUNTER — Other Ambulatory Visit (INDEPENDENT_AMBULATORY_CARE_PROVIDER_SITE_OTHER): Payer: 59

## 2014-01-16 DIAGNOSIS — E109 Type 1 diabetes mellitus without complications: Secondary | ICD-10-CM

## 2014-01-16 LAB — BASIC METABOLIC PANEL
BUN: 14 mg/dL (ref 6–23)
CO2: 27 mEq/L (ref 19–32)
Calcium: 9.6 mg/dL (ref 8.4–10.5)
Chloride: 99 mEq/L (ref 96–112)
Creatinine, Ser: 0.9 mg/dL (ref 0.4–1.2)
GFR: 87.13 mL/min (ref >60.00)
Glucose, Bld: 219 mg/dL — ABNORMAL HIGH (ref 70–99)
POTASSIUM: 3.8 meq/L (ref 3.5–5.1)
SODIUM: 137 meq/L (ref 135–145)

## 2014-01-16 LAB — HEMOGLOBIN A1C: HEMOGLOBIN A1C: 8 % — AB (ref 4.6–6.5)

## 2014-01-23 ENCOUNTER — Ambulatory Visit (INDEPENDENT_AMBULATORY_CARE_PROVIDER_SITE_OTHER): Payer: 59 | Admitting: Family Medicine

## 2014-01-23 ENCOUNTER — Other Ambulatory Visit: Payer: Self-pay | Admitting: Family Medicine

## 2014-01-23 ENCOUNTER — Encounter: Payer: Self-pay | Admitting: Family Medicine

## 2014-01-23 VITALS — BP 136/78 | HR 60 | Temp 98.7°F | Ht 68.75 in | Wt 189.6 lb

## 2014-01-23 DIAGNOSIS — E109 Type 1 diabetes mellitus without complications: Secondary | ICD-10-CM

## 2014-01-23 DIAGNOSIS — I10 Essential (primary) hypertension: Secondary | ICD-10-CM

## 2014-01-23 DIAGNOSIS — Z23 Encounter for immunization: Secondary | ICD-10-CM

## 2014-01-23 NOTE — Progress Notes (Signed)
   Subjective:    Patient ID: Anna Wiggins, female    DOB: 1950-09-01, 63 y.o.   MRN: [id number]  HPI Anna Wiggins is a 63 year old female who comes in today for followup of diabetes  She's on metformin 500 twice a day, glipizide 10 mg twice a day, insulin 36 units of 7030 twice daily. Morning sugars are 70-80. Afternoon sugar should jump up to 160-200 and A1c up to 8.0%.  Blood pressure normal 136/78   Review of Systems    review of systems otherwise negative no hypoglycemia Objective:   Physical Exam  Well-developed well-nourished female no acute distress vital signs stable she's afebrile BP at goal  Blood sugar not at goal      Assessment & Plan:  Diabetes type 1 not at goal............ increase morning insulin to 40 units daily, continue the evening dose to 36 units because her morning blood sugars are normal, followup in 2 months  Hypertension at goal continue current therapy

## 2014-01-23 NOTE — Patient Instructions (Signed)
Increase your morning dose of insulin to 40 units daily............ continue your evening dose to 36 units  Continue your medications  Walk 30 minutes daily  Followup in 3 months  Nonfasting labs,,,,,,,, 11 AM or 4 PM,,,,,,,,, one week prior to office visit

## 2014-01-23 NOTE — Progress Notes (Signed)
Pre visit review using our clinic review tool, if applicable. No additional management support is needed unless otherwise documented below in the visit note. 

## 2014-02-01 ENCOUNTER — Telehealth: Payer: Self-pay | Admitting: Family Medicine

## 2014-02-01 DIAGNOSIS — E109 Type 1 diabetes mellitus without complications: Secondary | ICD-10-CM

## 2014-02-01 NOTE — Telephone Encounter (Signed)
Pt states she would like to talk to you about her diabetes.

## 2014-02-02 ENCOUNTER — Other Ambulatory Visit: Payer: Self-pay | Admitting: Family Medicine

## 2014-02-02 DIAGNOSIS — E109 Type 1 diabetes mellitus without complications: Secondary | ICD-10-CM

## 2014-02-02 NOTE — Telephone Encounter (Signed)
Spoke with patient and her glucose has been dropping in the mornings.  The lowest was 58.  She has lowered her insuline to 40 units in the morning and 28 units in the evening.  She now as normal readings.  She also request a referral to Endo.

## 2014-04-17 ENCOUNTER — Other Ambulatory Visit: Payer: 59

## 2014-04-24 ENCOUNTER — Ambulatory Visit: Payer: 59 | Admitting: Family Medicine

## 2014-05-01 ENCOUNTER — Other Ambulatory Visit: Payer: Self-pay | Admitting: Family Medicine

## 2014-05-10 LAB — HM MAMMOGRAPHY: HM MAMMO: NEGATIVE

## 2014-05-18 ENCOUNTER — Encounter: Payer: Self-pay | Admitting: Family Medicine

## 2014-06-06 ENCOUNTER — Other Ambulatory Visit: Payer: Self-pay | Admitting: Family Medicine

## 2014-08-07 ENCOUNTER — Encounter: Payer: Self-pay | Admitting: Family Medicine

## 2014-08-07 ENCOUNTER — Ambulatory Visit (INDEPENDENT_AMBULATORY_CARE_PROVIDER_SITE_OTHER): Payer: 59 | Admitting: Family Medicine

## 2014-08-07 VITALS — BP 116/72 | HR 75 | Temp 98.2°F | Ht 66.0 in | Wt 190.8 lb

## 2014-08-07 DIAGNOSIS — I10 Essential (primary) hypertension: Secondary | ICD-10-CM | POA: Diagnosis not present

## 2014-08-07 DIAGNOSIS — E109 Type 1 diabetes mellitus without complications: Secondary | ICD-10-CM

## 2014-08-07 DIAGNOSIS — E785 Hyperlipidemia, unspecified: Secondary | ICD-10-CM

## 2014-08-07 MED ORDER — AMLODIPINE BESYLATE 5 MG PO TABS: ORAL_TABLET | ORAL | Status: DC

## 2014-08-07 NOTE — Assessment & Plan Note (Signed)
Followed by endocrinology. Has follow up appt with Dr. Gabriel Carina next month- has had less episodes of hypoglycemia.

## 2014-08-07 NOTE — Progress Notes (Signed)
Pre visit review using our clinic review tool, if applicable. No additional management support is needed unless otherwise documented below in the visit note. 

## 2014-08-07 NOTE — Assessment & Plan Note (Signed)
Now with symptoms of hypotension. Will wean down amlodipine to 5 mg daily- will likely be able to d/c this in July at her CPX. The patient indicates understanding of these issues and agrees with the plan.

## 2014-08-07 NOTE — Progress Notes (Signed)
Subjective:   Patient ID: Anna Wiggins, female    DOB: [birth year], 64 y.o.   MRN: [id number]  Anna Wiggins is a pleasant 64 y.o. year old female pt new to me, who presents to clinic today with [place]  on 08/07/2014  HPI:  Has been seeing Dr. Sherren Mocha who is phasing into retirement. Chart reviewed.  I see her husband, Gwynn Burly.  Type 1 diabetes- followed by Dr. Gabriel Carina.  Has follow up with her in May 2016.   Awaiting records- per pt, blood sugars improving with new insulin.  HTN- BP is low today and she does admit to feeling dizzy at times when she stands from a seated position.  She is now retired from [employer]- job was stressful.  Taking four antihypertensives currently- Norvasc 10 mg daily, HCZT 25 mg daily, Atenolol 50 mg daily and Cozaar 100 mg daily. Lab Results  Component Value Date   CREATININE 0.9 01/16/2014   HLD- taking zocor 40 mg daily. Lab Results  Component Value Date   CHOL 92 10/13/2013   HDL 36.60* 10/13/2013   LDLCALC 41 10/13/2013   TRIG 74.0 10/13/2013   CHOLHDL 3 10/13/2013   Lab Results  Component Value Date   ALT 25 10/13/2013   AST 22 10/13/2013   ALKPHOS 56 10/13/2013   BILITOT 0.5 10/13/2013   Lab Results  Component Value Date   WBC 5.7 10/13/2013   HGB 11.7* 10/13/2013   HCT 37.4 10/13/2013   MCV 71.3* 10/13/2013   PLT 206.0 10/13/2013   Current Outpatient Prescriptions on File Prior to Visit  Medication Sig Dispense Refill  . atenolol (TENORMIN) 50 MG tablet TAKE 1 TABLET BY MOUTH EVERY DAY 90 tablet 3  . fish oil-omega-3 fatty acids 1000 MG capsule Take 1 g by mouth daily. 3 tablets daily     . GLIPIZIDE XL 10 MG 24 hr tablet Take 1 tablet by mouth two  times daily 180 tablet 3  . hydrochlorothiazide (HYDRODIURIL) 25 MG tablet Take 1 tablet by mouth  daily 100 tablet 3  . Insulin Syringe-Needle U-100 (B-D INS SYR ULTRAFINE 1CC/30G) 30G X 1/2" 1 ML MISC USE AS DIRECTED 100 each 3  . Insulin Syringe-Needle U-100 30G X 1/2" 0.3 ML  MISC Inject 1 Syringe as directed 2 (two) times daily. 200 each 3  . losartan (COZAAR) 100 MG tablet TAKE 1 TABLET BY MOUTH EVERY DAY 90 tablet 3  . metFORMIN (GLUCOPHAGE) 500 MG tablet Take 1 tablet by mouth 2  times daily with a meal 180 tablet 3  . ONE TOUCH ULTRA TEST test strip USE 1 STRIP DAILY AS DIRECTED 100 each 2  . ONE TOUCH ULTRA TEST test strip Use every day 100 each 3  . potassium chloride SA (K-DUR,KLOR-CON) 20 MEQ tablet One tab daily 90 tablet 3  . simvastatin (ZOCOR) 40 MG tablet TAKE 1 TABLET (40 MG TOTAL) BY MOUTH AT BEDTIME. 90 tablet 3   Current Facility-Administered Medications on File Prior to Visit  Medication Dose Route Frequency Provider Last Rate Last Dose  . 0.9 %  sodium chloride infusion  500 mL Intravenous Continuous Sable Feil, MD        No Known Allergies  Past Medical History  Diagnosis Date  . Diabetes mellitus   . Hypertension   . GERD (gastroesophageal reflux disease)   . Hyperlipidemia   . Cancer     cervical cancer    Past Surgical History  Procedure Laterality Date  .  Vaginal hysterectomy    . Tonsillectomy      Family History  Problem Relation Age of Onset  . Diabetes Mother   . Cancer Mother   . Diabetes Sister   . Diabetes Brother   . Cancer Maternal Aunt   . Heart disease Maternal Grandfather     History   Social History  . Marital Status: Married    Spouse Name: N/A  . Number of Children: N/A  . Years of Education: N/A   Occupational History  . Not on file.   Social History Main Topics  . Smoking status: Never Smoker   . Smokeless tobacco: Never Used  . Alcohol Use: No  . Drug Use: No  . Sexual Activity: Yes   Other Topics Concern  . Not on file   Social History Narrative   The PMH, PSH, Social History, Family History, Medications, and allergies have been reviewed in [facility], and have been updated if relevant.   Review of Systems  Constitutional: Negative.   Respiratory: Negative.   Cardiovascular:  Negative.   Gastrointestinal: Negative.   Endocrine: Negative.   Musculoskeletal: Negative.   Skin: Negative.   Allergic/Immunologic: Negative.   Neurological: Negative.   Hematological: Negative.   Psychiatric/Behavioral: Negative.   All other systems reviewed and are negative.      Objective:    BP 116/72 mmHg  Pulse 75  Temp(Src) 98.2 F (36.8 C) (Oral)  Ht 5\' 6"  (1.676 m)  Wt 190 lb 12 oz (86.524 kg)  BMI 30.80 kg/m2  SpO2 95%   Physical Exam  Constitutional: She is oriented to person, place, and time. She appears well-developed and well-nourished. No distress.  HENT:  Head: Normocephalic.  Eyes: Conjunctivae are normal.  Neck: Normal range of motion.  Cardiovascular: Normal rate and regular rhythm.   Pulmonary/Chest: Effort normal and breath sounds normal. No respiratory distress.  Abdominal: Soft.  Musculoskeletal: She exhibits no edema.  Neurological: She is alert and oriented to person, place, and time. No cranial nerve deficit.  Skin: Skin is warm and dry.  Psychiatric: She has a normal mood and affect. Her behavior is normal. Judgment and thought content normal.  Nursing note and vitals reviewed.         Assessment & Plan:   Type 1 diabetes mellitus without complication  HLD (hyperlipidemia)  Essential hypertension - Plan: amLODipine (NORVASC) 5 MG tablet No Follow-up on file.

## 2014-08-07 NOTE — Assessment & Plan Note (Signed)
Has been well controlled on current dose of zocor. Will be due for CPX labs in 10/2014.

## 2014-08-07 NOTE — Patient Instructions (Addendum)
It was great to meet you.  Please schedule your complete physical on your way out today- after October 21, 2014.  Please cut your amlodipine in half- take 5 mg by mouth daily.  We will hopefully stop this medicine in July.

## 2014-09-18 ENCOUNTER — Telehealth: Payer: Self-pay | Admitting: Family Medicine

## 2014-09-18 NOTE — Telephone Encounter (Signed)
Spoke to pt and scheduled back pain with RBaity for 6/7

## 2014-09-18 NOTE — Telephone Encounter (Signed)
Pt called requesting call back. Please call home number, thanks.

## 2014-09-19 ENCOUNTER — Encounter: Payer: Self-pay | Admitting: Internal Medicine

## 2014-09-19 ENCOUNTER — Ambulatory Visit (INDEPENDENT_AMBULATORY_CARE_PROVIDER_SITE_OTHER): Payer: 59 | Admitting: Internal Medicine

## 2014-09-19 VITALS — BP 146/76 | HR 58 | Temp 97.7°F | Wt 189.0 lb

## 2014-09-19 DIAGNOSIS — S39012A Strain of muscle, fascia and tendon of lower back, initial encounter: Secondary | ICD-10-CM | POA: Diagnosis not present

## 2014-09-19 MED ORDER — IBUPROFEN 800 MG PO TABS: 800.0000 mg | ORAL_TABLET | Freq: Three times a day (TID) | ORAL | Status: DC | PRN

## 2014-09-19 NOTE — Patient Instructions (Addendum)
You have a lumbar strain I gave you a RX for Ibuprofen, take up to three times a day with food No Aleve while taking Ibuprofen Ok to continue Thermacare patches or use a heating pad Stretching exercises will help  Back Exercises These exercises may help you when beginning to rehabilitate your injury. Your symptoms may resolve with or without further involvement from your physician, physical therapist or athletic trainer. While completing these exercises, remember:   Restoring tissue flexibility helps normal motion to return to the joints. This allows healthier, less painful movement and activity.  An effective stretch should be held for at least 30 seconds.  A stretch should never be painful. You should only feel a gentle lengthening or release in the stretched tissue. STRETCH - Extension, Prone on Elbows   Lie on your stomach on the floor, a bed will be too soft. Place your palms about shoulder width apart and at the height of your head.  Place your elbows under your shoulders. If this is too painful, stack pillows under your chest.  Allow your body to relax so that your hips drop lower and make contact more completely with the floor.  Hold this position for __________ seconds.  Slowly return to lying flat on the floor. Repeat __________ times. Complete this exercise __________ times per day.  RANGE OF MOTION - Extension, Prone Press Ups   Lie on your stomach on the floor, a bed will be too soft. Place your palms about shoulder width apart and at the height of your head.  Keeping your back as relaxed as possible, slowly straighten your elbows while keeping your hips on the floor. You may adjust the placement of your hands to maximize your comfort. As you gain motion, your hands will come more underneath your shoulders.  Hold this position __________ seconds.  Slowly return to lying flat on the floor. Repeat __________ times. Complete this exercise __________ times per day.  RANGE  OF MOTION- Quadruped, Neutral Spine   Assume a hands and knees position on a firm surface. Keep your hands under your shoulders and your knees under your hips. You may place padding under your knees for comfort.  Drop your head and point your tail bone toward the ground below you. This will round out your low back like an angry cat. Hold this position for __________ seconds.  Slowly lift your head and release your tail bone so that your back sags into a large arch, like an old horse.  Hold this position for __________ seconds.  Repeat this until you feel limber in your low back.  Now, find your "sweet spot." This will be the most comfortable position somewhere between the two previous positions. This is your neutral spine. Once you have found this position, tense your stomach muscles to support your low back.  Hold this position for __________ seconds. Repeat __________ times. Complete this exercise __________ times per day.  STRETCH - Flexion, Single Knee to Chest   Lie on a firm bed or floor with both legs extended in front of you.  Keeping one leg in contact with the floor, bring your opposite knee to your chest. Hold your leg in place by either grabbing behind your thigh or at your knee.  Pull until you feel a gentle stretch in your low back. Hold __________ seconds.  Slowly release your grasp and repeat the exercise with the opposite side. Repeat __________ times. Complete this exercise __________ times per day.  STRETCH - Hamstrings,  Standing  Stand or sit and extend your right / left leg, placing your foot on a chair or foot stool  Keeping a slight arch in your low back and your hips straight forward.  Lead with your chest and lean forward at the waist until you feel a gentle stretch in the back of your right / left knee or thigh. (When done correctly, this exercise requires leaning only a small distance.)  Hold this position for __________ seconds. Repeat __________ times.  Complete this stretch __________ times per day. STRENGTHENING - Deep Abdominals, Pelvic Tilt   Lie on a firm bed or floor. Keeping your legs in front of you, bend your knees so they are both pointed toward the ceiling and your feet are flat on the floor.  Tense your lower abdominal muscles to press your low back into the floor. This motion will rotate your pelvis so that your tail bone is scooping upwards rather than pointing at your feet or into the floor.  With a gentle tension and even breathing, hold this position for __________ seconds. Repeat __________ times. Complete this exercise __________ times per day.  STRENGTHENING - Abdominals, Crunches   Lie on a firm bed or floor. Keeping your legs in front of you, bend your knees so they are both pointed toward the ceiling and your feet are flat on the floor. Cross your arms over your chest.  Slightly tip your chin down without bending your neck.  Tense your abdominals and slowly lift your trunk high enough to just clear your shoulder blades. Lifting higher can put excessive stress on the low back and does not further strengthen your abdominal muscles.  Control your return to the starting position. Repeat __________ times. Complete this exercise __________ times per day.  STRENGTHENING - Quadruped, Opposite UE/LE Lift   Assume a hands and knees position on a firm surface. Keep your hands under your shoulders and your knees under your hips. You may place padding under your knees for comfort.  Find your neutral spine and gently tense your abdominal muscles so that you can maintain this position. Your shoulders and hips should form a rectangle that is parallel with the floor and is not twisted.  Keeping your trunk steady, lift your right hand no higher than your shoulder and then your left leg no higher than your hip. Make sure you are not holding your breath. Hold this position __________ seconds.  Continuing to keep your abdominal muscles  tense and your back steady, slowly return to your starting position. Repeat with the opposite arm and leg. Repeat __________ times. Complete this exercise __________ times per day. Document Released: 04/18/2005 Document Revised: 06/23/2011 Document Reviewed: 07/13/2008 [facility] Patient Information 2015 [place], [place]. This information is not intended to replace advice given to you by your health care provider. Make sure you discuss any questions you have with your health care provider.

## 2014-09-19 NOTE — Progress Notes (Signed)
Subjective:    Patient ID: Anna Wiggins, female    DOB: [birth year], 64 y.o.   MRN: [id number]  HPI  Pt presents to the clinic today with c/o low back pain. She reports this started 5 days ago. She was getting out of her car when she reached into the back seat to get something; she immediately felt pain in her lower back. She describes the pain as achy. It can be sharp when she moves from a sitting to a standing position. The pain does not radiate. She denies numbness or tingling in her legs. She does feel like her muscles in her lower back are tight. She has tried Aleve without any relief. Thermacare patches did seem to help. She denies loss of bowel or bladder.  Review of Systems      Past Medical History  Diagnosis Date  . Diabetes mellitus   . Hypertension   . GERD (gastroesophageal reflux disease)   . Hyperlipidemia   . Cancer     cervical cancer    Current Outpatient Prescriptions  Medication Sig Dispense Refill  . amLODipine (NORVASC) 5 MG tablet TAKE 1 TABLET (10 MG TOTAL) BY MOUTH DAILY.    Marland Kitchen atenolol (TENORMIN) 50 MG tablet TAKE 1 TABLET BY MOUTH EVERY DAY 90 tablet 3  . fish oil-omega-3 fatty acids 1000 MG capsule Take 1 g by mouth daily. 3 tablets daily     . GLIPIZIDE XL 10 MG 24 hr tablet Take 1 tablet by mouth two  times daily 180 tablet 3  . hydrochlorothiazide (HYDRODIURIL) 25 MG tablet Take 1 tablet by mouth  daily 100 tablet 3  . Insulin Glargine 300 UNIT/ML SOPN Inject 40 mg into the skin at bedtime.    . insulin glulisine (APIDRA) 100 UNIT/ML injection Inject into the skin. Inject 10 units before breakfast and 14 units before lunch and dinner    . Insulin Syringe-Needle U-100 (B-D INS SYR ULTRAFINE 1CC/30G) 30G X 1/2" 1 ML MISC USE AS DIRECTED 100 each 3  . Insulin Syringe-Needle U-100 30G X 1/2" 0.3 ML MISC Inject 1 Syringe as directed 2 (two) times daily. 200 each 3  . losartan (COZAAR) 100 MG tablet TAKE 1 TABLET BY MOUTH EVERY DAY 90 tablet 3  .  metFORMIN (GLUCOPHAGE) 500 MG tablet Take 1 tablet by mouth 2  times daily with a meal 180 tablet 3  . ONE TOUCH ULTRA TEST test strip USE 1 STRIP DAILY AS DIRECTED 100 each 2  . ONE TOUCH ULTRA TEST test strip Use every day 100 each 3  . potassium chloride SA (K-DUR,KLOR-CON) 20 MEQ tablet One tab daily 90 tablet 3  . simvastatin (ZOCOR) 40 MG tablet TAKE 1 TABLET (40 MG TOTAL) BY MOUTH AT BEDTIME. 90 tablet 3   Current Facility-Administered Medications  Medication Dose Route Frequency Provider Last Rate Last Dose  . 0.9 %  sodium chloride infusion  500 mL Intravenous Continuous Sable Feil, MD        No Known Allergies  Family History  Problem Relation Age of Onset  . Diabetes Mother   . Cancer Mother   . Diabetes Sister   . Diabetes Brother   . Cancer Maternal Aunt   . Heart disease Maternal Grandfather     History   Social History  . Marital Status: Married    Spouse Name: N/A  . Number of Children: N/A  . Years of Education: N/A   Occupational History  . Not on file.  Social History Main Topics  . Smoking status: Never Smoker   . Smokeless tobacco: Never Used  . Alcohol Use: No  . Drug Use: No  . Sexual Activity: Yes   Other Topics Concern  . Not on file   Social History Narrative     Constitutional: Denies fever, malaise, fatigue, headache or abrupt weight changes.  Respiratory: Denies difficulty breathing, shortness of breath, cough or sputum production.   Cardiovascular: Denies chest pain, chest tightness, palpitations or swelling in the hands or feet.  Musculoskeletal: Pt reports low back pain. Denies joint swelling.  Neurological: Denies numbness or tingling in hands or feet, or problems with balance and coordination.   No other specific complaints in a complete review of systems (except as listed in HPI above).  Objective:   Physical Exam   BP 146/76 mmHg  Pulse 58  Temp(Src) 97.7 F (36.5 C) (Oral)  Wt 189 lb (85.73 kg)  SpO2 98% Wt  Readings from Last 3 Encounters:  09/19/14 189 lb (85.73 kg)  08/07/14 190 lb 12 oz (86.524 kg)  01/23/14 189 lb 9.6 oz (86.002 kg)    General: Appears her stated age, well developed, well nourished in NAD. Cardiovascular: Normal rate and rhythm. S1,S2 noted.  No murmur, rubs or gallops noted. Pulmonary/Chest: Normal effort and positive vesicular breath sounds. No respiratory distress. No wheezes, rales or ronchi noted.  Musculoskeletal: Decreased flexion and rotation due to pain. Normal extension. Pain with palpation over the lumbar spine. Paralumbar muscles soft, not tense. Strength 5/5 BLE. Neurological: Alert and oriented. Sensation intact to BLE.  BMET    Component Value Date/Time   NA 137 01/16/2014 1022   K 3.8 01/16/2014 1022   CL 99 01/16/2014 1022   CO2 27 01/16/2014 1022   GLUCOSE 219* 01/16/2014 1022   BUN 14 01/16/2014 1022   CREATININE 0.9 01/16/2014 1022   CALCIUM 9.6 01/16/2014 1022   GFRNONAA 112.26 04/05/2010 0939   GFRAA 95 05/12/2008 1016    Lipid Panel     Component Value Date/Time   CHOL 92 10/13/2013 0911   TRIG 74.0 10/13/2013 0911   HDL 36.60* 10/13/2013 0911   CHOLHDL 3 10/13/2013 0911   VLDL 14.8 10/13/2013 0911   LDLCALC 41 10/13/2013 0911    CBC    Component Value Date/Time   WBC 5.7 10/13/2013 0911   RBC 5.25* 10/13/2013 0911   HGB 11.7* 10/13/2013 0911   HCT 37.4 10/13/2013 0911   PLT 206.0 10/13/2013 0911   MCV 71.3* 10/13/2013 0911   MCHC 31.4 10/13/2013 0911   RDW 15.6* 10/13/2013 0911   LYMPHSABS 1.7 10/13/2013 0911   MONOABS 0.5 10/13/2013 0911   EOSABS 0.1 10/13/2013 0911   BASOSABS 0.0 10/13/2013 0911    Hgb A1C Lab Results  Component Value Date   HGBA1C 8.0* 01/16/2014        Assessment & Plan:   Lumbar Strain:  Stretching exercises given Avoid heavy lifting or bending at the waist eRx for Ibuprofen 800 mg TID prn, but take with food and only when needed. No Aleve while taking Ibuprofen (kidney function from  03/2014 reviewed) Ok to continue Thermacare patches or use a heating pad  If pain persist or worsens, will obtain xray of lumbar spine  RTC as needed

## 2014-09-19 NOTE — Progress Notes (Signed)
Pre visit review using our clinic review tool, if applicable. No additional management support is needed unless otherwise documented below in the visit note. 

## 2014-10-04 ENCOUNTER — Telehealth: Payer: Self-pay

## 2014-10-04 NOTE — Telephone Encounter (Signed)
Diabetic Bundle. Left voicemail advising pt her A1C blood test is due. Pt advised to contact PCP's office to schedule.  

## 2014-10-09 ENCOUNTER — Telehealth: Payer: Self-pay | Admitting: Family Medicine

## 2014-10-09 NOTE — Telephone Encounter (Signed)
Per pt chart, they have started back making diabetic bundle calls and pt is needing a lab appt scheduled for a1C

## 2014-10-09 NOTE — Telephone Encounter (Signed)
Patient called.  She asked for [facility] to call her back about having her A1c tested.

## 2014-10-09 NOTE — Telephone Encounter (Signed)
Patient wanted to check with [facility]os since she had it checked in May to make sure she could have it done this soon and her insurance would pay for it.

## 2014-10-10 NOTE — Telephone Encounter (Signed)
Spoke to pt who confirms she had a1c completed at alternate location in May, and will drop off results to be scanned into chart. Advised pt repeat not required until August

## 2014-10-10 NOTE — Telephone Encounter (Signed)
Is she still needing to have them checked since she received a call for diabetic bundle or is she to wait until at least, August

## 2014-10-10 NOTE — Telephone Encounter (Signed)
The last documented A1C in her chart is 01/16/2014.  Has she had this done elsewhere?  If so, she only needs to have it repeated if it has been greater than 3 months since her last test.  Otherwise, please have patient schedule lab appointment.

## 2014-10-23 ENCOUNTER — Other Ambulatory Visit: Payer: Self-pay | Admitting: Family Medicine

## 2014-10-23 ENCOUNTER — Other Ambulatory Visit (INDEPENDENT_AMBULATORY_CARE_PROVIDER_SITE_OTHER): Payer: 59

## 2014-10-23 DIAGNOSIS — Z0000 Encounter for general adult medical examination without abnormal findings: Secondary | ICD-10-CM | POA: Diagnosis not present

## 2014-10-23 DIAGNOSIS — E109 Type 1 diabetes mellitus without complications: Secondary | ICD-10-CM | POA: Diagnosis not present

## 2014-10-23 DIAGNOSIS — I10 Essential (primary) hypertension: Secondary | ICD-10-CM

## 2014-10-23 DIAGNOSIS — E785 Hyperlipidemia, unspecified: Secondary | ICD-10-CM

## 2014-10-23 DIAGNOSIS — Z01419 Encounter for gynecological examination (general) (routine) without abnormal findings: Secondary | ICD-10-CM

## 2014-10-23 LAB — COMPREHENSIVE METABOLIC PANEL
ALBUMIN: 4.1 g/dL (ref 3.5–5.2)
ALT: 19 U/L (ref 0–35)
AST: 15 U/L (ref 0–37)
Alkaline Phosphatase: 59 U/L (ref 39–117)
BUN: 11 mg/dL (ref 6–23)
CO2: 28 mEq/L (ref 19–32)
Calcium: 9.2 mg/dL (ref 8.4–10.5)
Chloride: 101 mEq/L (ref 96–112)
Creatinine, Ser: 0.78 mg/dL (ref 0.40–1.20)
GFR: 95.97 mL/min (ref >60.00)
Glucose, Bld: 181 mg/dL — ABNORMAL HIGH (ref 70–99)
Potassium: 3.1 mEq/L — ABNORMAL LOW (ref 3.5–5.1)
Sodium: 139 mEq/L (ref 135–145)
Total Bilirubin: 0.5 mg/dL (ref 0.2–1.2)
Total Protein: 7.6 g/dL (ref 6.0–8.3)

## 2014-10-23 LAB — CBC WITH DIFFERENTIAL/PLATELET
Basophils Absolute: 0 10*3/uL (ref 0.0–0.1)
Basophils Relative: 0.4 % (ref 0.0–3.0)
EOS ABS: 0.2 10*3/uL (ref 0.0–0.7)
Eosinophils Relative: 2.5 % (ref 0.0–5.0)
HCT: 37.5 % (ref 36.0–46.0)
Hemoglobin: 12.1 g/dL (ref 12.0–15.0)
LYMPHS ABS: 1.7 10*3/uL (ref 0.7–4.0)
LYMPHS PCT: 23.3 % (ref 12.0–46.0)
MCHC: 32.1 g/dL (ref 30.0–36.0)
MCV: 70.6 fl — AB (ref 78.0–100.0)
Monocytes Absolute: 0.5 10*3/uL (ref 0.1–1.0)
Monocytes Relative: 6.6 % (ref 3.0–12.0)
NEUTROS ABS: 4.9 10*3/uL (ref 1.4–7.7)
Neutrophils Relative %: 67.2 % (ref 43.0–77.0)
PLATELETS: 196 10*3/uL (ref 150.0–400.0)
RBC: 5.32 Mil/uL — ABNORMAL HIGH (ref 3.87–5.11)
RDW: 15.2 % (ref 11.5–15.5)
WBC: 7.2 10*3/uL (ref 4.0–10.5)

## 2014-10-23 LAB — LIPID PANEL
CHOL/HDL RATIO: 3
CHOLESTEROL: 97 mg/dL (ref 0–200)
HDL: 36 mg/dL — ABNORMAL LOW (ref >39.00)
LDL CALC: 45 mg/dL (ref 0–99)
NonHDL: 61
TRIGLYCERIDES: 82 mg/dL (ref 0.0–149.0)
VLDL: 16.4 mg/dL (ref 0.0–40.0)

## 2014-10-23 LAB — TSH: TSH: 2.38 u[IU]/mL (ref 0.35–4.50)

## 2014-10-23 LAB — HEMOGLOBIN A1C: Hgb A1c MFr Bld: 7.9 % — ABNORMAL HIGH (ref 4.6–6.5)

## 2014-11-02 ENCOUNTER — Ambulatory Visit (INDEPENDENT_AMBULATORY_CARE_PROVIDER_SITE_OTHER): Payer: 59 | Admitting: Family Medicine

## 2014-11-02 ENCOUNTER — Encounter: Payer: Self-pay | Admitting: Family Medicine

## 2014-11-02 VITALS — BP 132/68 | HR 68 | Temp 98.1°F | Ht 66.0 in | Wt 190.0 lb

## 2014-11-02 DIAGNOSIS — E109 Type 1 diabetes mellitus without complications: Secondary | ICD-10-CM

## 2014-11-02 DIAGNOSIS — K219 Gastro-esophageal reflux disease without esophagitis: Secondary | ICD-10-CM | POA: Diagnosis not present

## 2014-11-02 DIAGNOSIS — I10 Essential (primary) hypertension: Secondary | ICD-10-CM | POA: Diagnosis not present

## 2014-11-02 DIAGNOSIS — M1990 Unspecified osteoarthritis, unspecified site: Secondary | ICD-10-CM

## 2014-11-02 DIAGNOSIS — Z01419 Encounter for gynecological examination (general) (routine) without abnormal findings: Secondary | ICD-10-CM | POA: Insufficient documentation

## 2014-11-02 DIAGNOSIS — E785 Hyperlipidemia, unspecified: Secondary | ICD-10-CM | POA: Diagnosis not present

## 2014-11-02 DIAGNOSIS — Z0000 Encounter for general adult medical examination without abnormal findings: Secondary | ICD-10-CM | POA: Diagnosis not present

## 2014-11-02 MED ORDER — AMLODIPINE BESYLATE 5 MG PO TABS: ORAL_TABLET | ORAL | Status: DC

## 2014-11-02 MED ORDER — ATENOLOL 25 MG PO TABS: ORAL_TABLET | ORAL | Status: DC

## 2014-11-02 MED ORDER — HYDROCHLOROTHIAZIDE 25 MG PO TABS: ORAL_TABLET | ORAL | Status: DC

## 2014-11-02 MED ORDER — LOSARTAN POTASSIUM 100 MG PO TABS: ORAL_TABLET | ORAL | Status: DC

## 2014-11-02 MED ORDER — POTASSIUM CHLORIDE CRYS ER 20 MEQ PO TBCR: EXTENDED_RELEASE_TABLET | ORAL | Status: DC

## 2014-11-02 MED ORDER — SIMVASTATIN 40 MG PO TABS: ORAL_TABLET | ORAL | Status: DC

## 2014-11-02 NOTE — Assessment & Plan Note (Signed)
At goal for diabetic. No changes made today. 

## 2014-11-02 NOTE — Assessment & Plan Note (Signed)
Could not tolerate decrease dose of amlodipine.  She would like a trial of decreasing another antihypertensive.  Decrease atenolol to 25 mg daily. She will check BP and call me if BP is consistently above 145/90. The patient indicates understanding of these issues and agrees with the plan.

## 2014-11-02 NOTE — Patient Instructions (Signed)
Great to see you. We are decreasing your atenolol to 25 mg daily.  Check your blood pressure and call me if you have consistent readings above 145/90.

## 2014-11-02 NOTE — Assessment & Plan Note (Signed)
Followed by endo. Deteriorated but she feels she is "back on track." Working on exercise.

## 2014-11-02 NOTE — Progress Notes (Signed)
Subjective:   Patient ID: Anna Wiggins, female    DOB: 1950/11/22, 64 y.o.   MRN: [id number]  Deija Buhrman is a pleasant 64 y.o. year old female pt new to me, who presents to clinic today with Annual Exam and follow up of chronic medical conditions on 11/02/2014  HPI:  Had been seeing Dr. Sherren Mocha - established care with me in 07/2014.  Colonoscopy 09/04/10- Dr. Sharlett Iles Zostavax 01/23/14 Pneumovax 01/23/14 Tdap 10/20/2013 Mammogram 05/10/14 Remote h/o hysterectomy-  Has GYN- Dr. Garwin Brothers. Has dermatologist- Dr. Phillip Heal - last seen 08/2014  Type 1 diabetes- followed by Dr. Gabriel Carina. Last seen on 5/27- note reviewed.  No changes made to rx.  Checks FSBS daily- did increase for a few weeks, not back to normal range- 88-119. Lab Results  Component Value Date   HGBA1C 7.9* 10/23/2014     HTN.  Taking four antihypertensives currently- was taking Norvasc 10 mg daily (which we decreased to 5 mg daily when she established care but she had to increase back to 10 mg daily as her BP was increasing), HCTZ 25 mg daily, Atenolol 50 mg daily and Cozaar 100 mg daily. Lab Results  Component Value Date   CREATININE 0.78 10/23/2014   HLD- taking zocor 40 mg daily.  At goal for a diabetic. Lab Results  Component Value Date   CHOL 97 10/23/2014   HDL 36.00* 10/23/2014   LDLCALC 45 10/23/2014   TRIG 82.0 10/23/2014   CHOLHDL 3 10/23/2014   Lab Results  Component Value Date   ALT 19 10/23/2014   AST 15 10/23/2014   ALKPHOS 59 10/23/2014   BILITOT 0.5 10/23/2014   Lab Results  Component Value Date   WBC 7.2 10/23/2014   HGB 12.1 10/23/2014   HCT 37.5 10/23/2014   MCV 70.6* 10/23/2014   PLT 196.0 10/23/2014   Current Outpatient Prescriptions on File Prior to Visit  Medication Sig Dispense Refill  . amLODipine (NORVASC) 5 MG tablet TAKE 1 TABLET (10 MG TOTAL) BY MOUTH DAILY.    Marland Kitchen atenolol (TENORMIN) 50 MG tablet TAKE 1 TABLET BY MOUTH EVERY DAY 90 tablet 3  . fish oil-omega-3 fatty acids  1000 MG capsule Take 1 g by mouth daily. 3 tablets daily     . GLIPIZIDE XL 10 MG 24 hr tablet Take 1 tablet by mouth two  times daily 180 tablet 3  . hydrochlorothiazide (HYDRODIURIL) 25 MG tablet Take 1 tablet by mouth  daily 100 tablet 3  . ibuprofen (ADVIL,MOTRIN) 800 MG tablet Take 1 tablet (800 mg total) by mouth every 8 (eight) hours as needed. 30 tablet 1  . Insulin Glargine 300 UNIT/ML SOPN Inject 40 mg into the skin at bedtime.    . insulin glulisine (APIDRA) 100 UNIT/ML injection Inject into the skin. Inject 10 units before breakfast and 14 units before lunch and dinner    . Insulin Syringe-Needle U-100 (B-D INS SYR ULTRAFINE 1CC/30G) 30G X 1/2" 1 ML MISC USE AS DIRECTED 100 each 3  . Insulin Syringe-Needle U-100 30G X 1/2" 0.3 ML MISC Inject 1 Syringe as directed 2 (two) times daily. 200 each 3  . losartan (COZAAR) 100 MG tablet TAKE 1 TABLET BY MOUTH EVERY DAY 90 tablet 3  . metFORMIN (GLUCOPHAGE) 500 MG tablet Take 1 tablet by mouth 2  times daily with a meal 180 tablet 3  . ONE TOUCH ULTRA TEST test strip USE 1 STRIP DAILY AS DIRECTED 100 each 2  . ONE TOUCH ULTRA  TEST test strip Use every day 100 each 3  . potassium chloride SA (K-DUR,KLOR-CON) 20 MEQ tablet One tab daily 90 tablet 3  . simvastatin (ZOCOR) 40 MG tablet TAKE 1 TABLET (40 MG TOTAL) BY MOUTH AT BEDTIME. 90 tablet 3   Current Facility-Administered Medications on File Prior to Visit  Medication Dose Route Frequency Provider Last Rate Last Dose  . 0.9 %  sodium chloride infusion  500 mL Intravenous Continuous Sable Feil, MD        No Known Allergies  Past Medical History  Diagnosis Date  . Diabetes mellitus   . Hypertension   . GERD (gastroesophageal reflux disease)   . Hyperlipidemia   . Cancer     cervical cancer    Past Surgical History  Procedure Laterality Date  . Vaginal hysterectomy    . Tonsillectomy      Family History  Problem Relation Age of Onset  . Diabetes Mother   . Cancer  Mother   . Diabetes Sister   . Diabetes Brother   . Cancer Maternal Aunt   . Heart disease Maternal Grandfather     History   Social History  . Marital Status: Married    Spouse Name: N/A  . Number of Children: N/A  . Years of Education: N/A   Occupational History  . Not on file.   Social History Main Topics  . Smoking status: Never Smoker   . Smokeless tobacco: Never Used  . Alcohol Use: No  . Drug Use: No  . Sexual Activity: Yes   Other Topics Concern  . Not on file   Social History Narrative   The PMH, PSH, Social History, Family History, Medications, and allergies have been reviewed in [facility], and have been updated if relevant.   Review of Systems  Constitutional: Negative.   Respiratory: Negative.   Cardiovascular: Negative.   Gastrointestinal: Negative.   Endocrine: Negative.   Musculoskeletal: Negative.   Skin: Negative.   Allergic/Immunologic: Negative.   Neurological: Negative.   Hematological: Negative.   Psychiatric/Behavioral: Negative.   All other systems reviewed and are negative.      Objective:    BP 132/68 mmHg  Pulse 68  Temp(Src) 98.1 F (36.7 C) (Oral)  Ht 5\' 6"  (1.676 m)  Wt 190 lb (86.183 kg)  BMI 30.68 kg/m2  SpO2 94%   Physical Exam  Constitutional: She is oriented to person, place, and time. She appears well-developed and well-nourished. No distress.  HENT:  Head: Normocephalic.  Eyes: Conjunctivae are normal.  Neck: Normal range of motion.  Cardiovascular: Normal rate and regular rhythm.   Pulmonary/Chest: Effort normal and breath sounds normal. No respiratory distress.  Abdominal: Soft.  Musculoskeletal: She exhibits no edema.  Neurological: She is alert and oriented to person, place, and time. No cranial nerve deficit.  Skin: Skin is warm and dry.  Psychiatric: She has a normal mood and affect. Her behavior is normal. Judgment and thought content normal.  Nursing note and vitals reviewed.         Assessment &  Plan:   Essential hypertension - Plan: amLODipine (NORVASC) 5 MG tablet, atenolol (TENORMIN) 50 MG tablet, hydrochlorothiazide (HYDRODIURIL) 25 MG tablet, losartan (COZAAR) 100 MG tablet, potassium chloride SA (K-DUR,KLOR-CON) 20 MEQ tablet  Hyperlipidemia - Plan: simvastatin (ZOCOR) 40 MG tablet No Follow-up on file.

## 2014-11-02 NOTE — Progress Notes (Signed)
Pre visit review using our clinic review tool, if applicable. No additional management support is needed unless otherwise documented below in the visit note. 

## 2014-11-02 NOTE — Assessment & Plan Note (Signed)
Reviewed preventive care protocols, scheduled due services, and updated immunizations Discussed nutrition, exercise, diet, and healthy lifestyle.  

## 2015-01-07 ENCOUNTER — Other Ambulatory Visit: Payer: Self-pay | Admitting: Family Medicine

## 2015-01-08 ENCOUNTER — Ambulatory Visit (INDEPENDENT_AMBULATORY_CARE_PROVIDER_SITE_OTHER): Payer: 59 | Admitting: Primary Care

## 2015-01-08 ENCOUNTER — Telehealth: Payer: Self-pay | Admitting: Family Medicine

## 2015-01-08 ENCOUNTER — Encounter: Payer: Self-pay | Admitting: Primary Care

## 2015-01-08 VITALS — BP 124/64 | HR 56 | Temp 98.5°F | Ht 66.0 in | Wt 190.4 lb

## 2015-01-08 DIAGNOSIS — H578 Other specified disorders of eye and adnexa: Secondary | ICD-10-CM | POA: Diagnosis not present

## 2015-01-08 DIAGNOSIS — H5789 Other specified disorders of eye and adnexa: Secondary | ICD-10-CM

## 2015-01-08 NOTE — Telephone Encounter (Signed)
Patient Name: Anna Wiggins DOB: 1950-10-13 Initial Comment Caller states eyes are swelling, can see out of them. Nurse Assessment Nurse: Ronnald Ramp, RN, Miranda Date/Time (Eastern Time): 01/08/2015 10:48:18 AM Confirm and document reason for call. If symptomatic, describe symptoms. ---Caller states her eyes have been swollen since yesterday evening. Her eyes lashes have crust on them from dried drainage. Denies fever. Has the patient traveled out of the country within the last 30 days? ---No Does the patient require triage? ---Yes Related visit to physician within the last 2 weeks? ---No Does the PT have any chronic conditions? (i.e. diabetes, asthma, etc.) ---Yes List chronic conditions. ---Diabetes Guidelines Guideline Title Affirmed Question Affirmed Notes Eye - Pus or Discharge Weak immune system (e.g., HIV positive, cancer chemo, splenectomy, organ transplant, chronic steroids) Final Disposition User See Physician within 24 Hours Ronnald Ramp, RN, Miranda Comments Appt scheduled for 1:00pm today with Alma Friendly NP, she did not want to wait until tomorrow to Dr. Deborra Medina. Referrals REFERRED TO PCP OFFICE Disagree/Comply: Comply

## 2015-01-08 NOTE — Progress Notes (Signed)
Pre visit review using our clinic review tool, if applicable. No additional management support is needed unless otherwise documented below in the visit note. 

## 2015-01-08 NOTE — Patient Instructions (Signed)
Start taking an antihistamine such as Zyrtec, Allegra, or Claritin daily for 2 weeks to help reduce swelling.  You may use allergy eye drops over the counter or Visine eye drops. You may also try applying warm compresses to your affected eye.  Please notify me if your swelling becomes worse or if you develop fevers and/or facial pain.  It was a pleasure meeting you!

## 2015-01-08 NOTE — Progress Notes (Signed)
Subjective:    Patient ID: Anna Wiggins, female    DOB: [birth year], 64 y.o.   MRN: [id number]  HPI  Anna Wiggins is a 64 year old female who presents today with a chief complaint of eye irritation and swelling just below her eyes. She describes her eyes as feeling itchy with clear drainage, and dryness. She first noticed her symptoms last night after washing the dishes. She's recently used new dish soap. The swelling below her left eye has improved but her right remains about the same. She took some benadryl last night. Denies sneezing, green drainage, fevers.   Review of Systems  Constitutional: Negative for fever and chills.  HENT: Positive for facial swelling. Negative for congestion, ear pain, postnasal drip, rhinorrhea and sneezing.   Eyes: Positive for discharge, redness and itching. Negative for pain and visual disturbance.  Respiratory: Negative for cough.        Past Medical History  Diagnosis Date  . Diabetes mellitus   . Hypertension   . GERD (gastroesophageal reflux disease)   . Hyperlipidemia   . Cancer     cervical cancer    Social History   Social History  . Marital Status: Married    Spouse Name: N/A  . Number of Children: N/A  . Years of Education: N/A   Occupational History  . Not on file.   Social History Main Topics  . Smoking status: Never Smoker   . Smokeless tobacco: Never Used  . Alcohol Use: No  . Drug Use: No  . Sexual Activity: Yes   Other Topics Concern  . Not on file   Social History Narrative    Past Surgical History  Procedure Laterality Date  . Vaginal hysterectomy    . Tonsillectomy      Family History  Problem Relation Age of Onset  . Diabetes Mother   . Cancer Mother   . Diabetes Sister   . Diabetes Brother   . Cancer Maternal Aunt   . Heart disease Maternal Grandfather     No Known Allergies  Current Outpatient Prescriptions on File Prior to Visit  Medication Sig Dispense Refill  . amLODipine (NORVASC) 5 MG  tablet TAKE 1 TABLET (10 MG TOTAL) BY MOUTH DAILY. 90 tablet 3  . atenolol (TENORMIN) 25 MG tablet TAKE 1 TABLET BY MOUTH EVERY DAY 90 tablet 3  . fish oil-omega-3 fatty acids 1000 MG capsule Take 1 g by mouth daily. 3 tablets daily     . GLIPIZIDE XL 10 MG 24 hr tablet Take 1 tablet by mouth two  times daily 180 tablet 3  . hydrochlorothiazide (HYDRODIURIL) 25 MG tablet Take 1 tablet by mouth  daily 100 tablet 3  . ibuprofen (ADVIL,MOTRIN) 800 MG tablet Take 1 tablet (800 mg total) by mouth every 8 (eight) hours as needed. 30 tablet 1  . Insulin Glargine 300 UNIT/ML SOPN Inject 40 mg into the skin at bedtime.    . insulin glulisine (APIDRA) 100 UNIT/ML injection Inject into the skin. Inject 10 units before breakfast and 14 units before lunch and dinner    . Insulin Syringe-Needle U-100 (B-D INS SYR ULTRAFINE 1CC/30G) 30G X 1/2" 1 ML MISC USE AS DIRECTED 100 each 3  . Insulin Syringe-Needle U-100 30G X 1/2" 0.3 ML MISC Inject 1 Syringe as directed 2 (two) times daily. 200 each 3  . losartan (COZAAR) 100 MG tablet TAKE 1 TABLET BY MOUTH EVERY DAY 90 tablet 3  . metFORMIN (GLUCOPHAGE) 500 MG  tablet Take 1 tablet by mouth 2  times daily with a meal 180 tablet 3  . ONE TOUCH ULTRA TEST test strip USE 1 STRIP DAILY AS DIRECTED 100 each 2  . ONE TOUCH ULTRA TEST test strip Use every day 100 each 3  . potassium chloride SA (K-DUR,KLOR-CON) 20 MEQ tablet One tab daily 90 tablet 3  . simvastatin (ZOCOR) 40 MG tablet TAKE 1 TABLET (40 MG TOTAL) BY MOUTH AT BEDTIME. 90 tablet 3   Current Facility-Administered Medications on File Prior to Visit  Medication Dose Route Frequency Provider Last Rate Last Dose  . 0.9 %  sodium chloride infusion  500 mL Intravenous Continuous Sable Feil, MD        BP 124/64 mmHg  Pulse 56  Temp(Src) 98.5 F (36.9 C) (Oral)  Ht 5\' 6"  (1.676 m)  Wt 190 lb 6.4 oz (86.365 kg)  BMI 30.75 kg/m2  SpO2 98%    Objective:   Physical Exam  Constitutional: She appears  well-nourished.  HENT:  Right Ear: Tympanic membrane and ear canal normal.  Left Ear: Tympanic membrane and ear canal normal.  Nose: Nose normal.  Mouth/Throat: Oropharynx is clear and moist.  Eyes: Conjunctivae and lids are normal. Pupils are equal, round, and reactive to light. Right eye exhibits no discharge. Left eye exhibits no discharge.  Minor swelling noted just below right eye. No swelling below left. Mild tenderness to right side.  Neck: Neck supple.  Cardiovascular: Normal rate and regular rhythm.   Pulmonary/Chest: Effort normal and breath sounds normal.  Skin: Skin is warm and dry.          Assessment & Plan:  Eye Irritation:  Present to bilateral eyes with itching, redness, and swelling just distal to eyes. Mild swelling under right eye, none below left. Exam without drainage, redness.  Suspect environmental allergies.  Start antihistamine such as claritin, zyrtec, allegra daily for 2 weeks. Warm compresses. Follow up PRN.

## 2015-05-16 ENCOUNTER — Encounter: Payer: Self-pay | Admitting: Family Medicine

## 2015-08-09 ENCOUNTER — Ambulatory Visit (INDEPENDENT_AMBULATORY_CARE_PROVIDER_SITE_OTHER): Payer: 59 | Admitting: Family Medicine

## 2015-08-09 ENCOUNTER — Ambulatory Visit: Payer: 59 | Admitting: Family Medicine

## 2015-08-09 ENCOUNTER — Encounter: Payer: Self-pay | Admitting: Family Medicine

## 2015-08-09 VITALS — BP 148/80 | HR 63 | Temp 97.9°F | Wt 192.8 lb

## 2015-08-09 DIAGNOSIS — J069 Acute upper respiratory infection, unspecified: Secondary | ICD-10-CM

## 2015-08-09 MED ORDER — HYDROCOD POLST-CPM POLST ER 10-8 MG/5ML PO SUER: 5.0000 mL | Freq: Two times a day (BID) | ORAL | Status: DC | PRN

## 2015-08-09 MED ORDER — AMOXICILLIN-POT CLAVULANATE 875-125 MG PO TABS: 1.0000 | ORAL_TABLET | Freq: Two times a day (BID) | ORAL | Status: AC

## 2015-08-09 NOTE — Progress Notes (Signed)
SUBJECTIVE:  Anna Wiggins is a 65 y.o. female who complains of coryza, congestion, sneezing, sore throat, cough, nasal blockage and bilateral sinus pain for 14 days. She denies a history of anorexia and shortness of breath and denies a history of asthma. Patient denies smoke cigarettes.   Current Outpatient Prescriptions on File Prior to Visit  Medication Sig Dispense Refill  . amLODipine (NORVASC) 5 MG tablet TAKE 1 TABLET (10 MG TOTAL) BY MOUTH DAILY. 90 tablet 3  . atenolol (TENORMIN) 25 MG tablet TAKE 1 TABLET BY MOUTH EVERY DAY 90 tablet 3  . fish oil-omega-3 fatty acids 1000 MG capsule Take 1 g by mouth daily. 3 tablets daily     . GLIPIZIDE XL 10 MG 24 hr tablet Take 1 tablet by mouth two  times daily 180 tablet 3  . hydrochlorothiazide (HYDRODIURIL) 25 MG tablet Take 1 tablet by mouth  daily 100 tablet 3  . ibuprofen (ADVIL,MOTRIN) 800 MG tablet Take 1 tablet (800 mg total) by mouth every 8 (eight) hours as needed. 30 tablet 1  . Insulin Glargine 300 UNIT/ML SOPN Inject 40 mg into the skin at bedtime.    . insulin glulisine (APIDRA) 100 UNIT/ML injection Inject into the skin. Inject 10 units before breakfast and 14 units before lunch and dinner    . Insulin Syringe-Needle U-100 (B-D INS SYR ULTRAFINE 1CC/30G) 30G X 1/2" 1 ML MISC USE AS DIRECTED 100 each 3  . Insulin Syringe-Needle U-100 30G X 1/2" 0.3 ML MISC Inject 1 Syringe as directed 2 (two) times daily. 200 each 3  . losartan (COZAAR) 100 MG tablet TAKE 1 TABLET BY MOUTH EVERY DAY 90 tablet 3  . metFORMIN (GLUCOPHAGE) 500 MG tablet Take 1 tablet by mouth 2  times daily with a meal 180 tablet 3  . ONE TOUCH ULTRA TEST test strip USE 1 STRIP DAILY AS DIRECTED 100 each 2  . ONE TOUCH ULTRA TEST test strip Use every day 100 each 3  . potassium chloride SA (K-DUR,KLOR-CON) 20 MEQ tablet One tab daily 90 tablet 3  . simvastatin (ZOCOR) 40 MG tablet TAKE 1 TABLET (40 MG TOTAL) BY MOUTH AT BEDTIME. 90 tablet 3   Current  Facility-Administered Medications on File Prior to Visit  Medication Dose Route Frequency Provider Last Rate Last Dose  . 0.9 %  sodium chloride infusion  500 mL Intravenous Continuous Sable Feil, MD        No Known Allergies  Past Medical History  Diagnosis Date  . Diabetes mellitus   . Hypertension   . GERD (gastroesophageal reflux disease)   . Hyperlipidemia   . Cancer [facility])     cervical cancer    Past Surgical History  Procedure Laterality Date  . Vaginal hysterectomy    . Tonsillectomy      Family History  Problem Relation Age of Onset  . Diabetes Mother   . Cancer Mother   . Diabetes Sister   . Diabetes Brother   . Cancer Maternal Aunt   . Heart disease Maternal Grandfather     Social History   Social History  . Marital Status: Married    Spouse Name: N/A  . Number of Children: N/A  . Years of Education: N/A   Occupational History  . Not on file.   Social History Main Topics  . Smoking status: Never Smoker   . Smokeless tobacco: Never Used  . Alcohol Use: No  . Drug Use: No  . Sexual Activity: Yes  Other Topics Concern  . Not on file   Social History Narrative   The PMH, PSH, Social History, Family History, Medications, and allergies have been reviewed in [facility], and have been updated if relevant.  OBJECTIVE:  BP 148/80 mmHg  Pulse 63  Temp(Src) 97.9 F (36.6 C) (Oral)  Wt 192 lb 12 oz (87.431 kg)  SpO2 98%  She appears well, vital signs are as noted. Ears normal.  Throat and pharynx normal.  Neck supple. No adenopathy in the neck. Nose is congested. Sinuses tender. The chest is clear, without wheezes or rales.  ASSESSMENT:  sinusitis  PLAN: Given duration and progression of symptoms, will treat for bacterial sinusitis.  Symptomatic therapy suggested: push fluids, rest and return office visit prn if symptoms persist or worsen.. Call or return to clinic prn if these symptoms worsen or fail to improve as anticipated.

## 2015-08-09 NOTE — Progress Notes (Signed)
Pre visit review using our clinic review tool, if applicable. No additional management support is needed unless otherwise documented below in the visit note. 

## 2015-08-09 NOTE — Patient Instructions (Signed)
Take antibiotic as directed.  Drink lots of fluids.    Treat sympotmatically with Mucinex, nasal saline irrigation, and Tylenol/Ibuprofen.   Also try an antihistamine/decongestant like claritin D or zyrtec D over the counter- two times a day as needed ( have to sign for them at pharmacy).   Try over the counter nasocort-start with 2 sprays per nostril per day...and then try to taper to 1 spray per nostril once symptoms improve.   You can use warm compresses.  Cough suppressant at night.   Call if not improving as expected in 5-7 days.    

## 2015-08-29 ENCOUNTER — Other Ambulatory Visit: Payer: Self-pay | Admitting: Family Medicine

## 2015-09-23 ENCOUNTER — Other Ambulatory Visit: Payer: Self-pay | Admitting: Family Medicine

## 2015-10-25 ENCOUNTER — Other Ambulatory Visit: Payer: Self-pay | Admitting: Family Medicine

## 2015-10-25 DIAGNOSIS — E109 Type 1 diabetes mellitus without complications: Secondary | ICD-10-CM

## 2015-10-25 DIAGNOSIS — Z01419 Encounter for gynecological examination (general) (routine) without abnormal findings: Secondary | ICD-10-CM

## 2015-10-25 DIAGNOSIS — E785 Hyperlipidemia, unspecified: Secondary | ICD-10-CM

## 2015-10-30 ENCOUNTER — Other Ambulatory Visit (INDEPENDENT_AMBULATORY_CARE_PROVIDER_SITE_OTHER): Payer: 59

## 2015-10-30 DIAGNOSIS — E785 Hyperlipidemia, unspecified: Secondary | ICD-10-CM

## 2015-10-30 DIAGNOSIS — Z0000 Encounter for general adult medical examination without abnormal findings: Secondary | ICD-10-CM | POA: Diagnosis not present

## 2015-10-30 DIAGNOSIS — Z01419 Encounter for gynecological examination (general) (routine) without abnormal findings: Secondary | ICD-10-CM

## 2015-10-30 LAB — COMPREHENSIVE METABOLIC PANEL
ALK PHOS: 48 U/L (ref 39–117)
ALT: 20 U/L (ref 0–35)
AST: 15 U/L (ref 0–37)
Albumin: 4 g/dL (ref 3.5–5.2)
BILIRUBIN TOTAL: 0.4 mg/dL (ref 0.2–1.2)
BUN: 13 mg/dL (ref 6–23)
CO2: 31 mEq/L (ref 19–32)
CREATININE: 0.71 mg/dL (ref 0.40–1.20)
Calcium: 9.1 mg/dL (ref 8.4–10.5)
Chloride: 102 mEq/L (ref 96–112)
GFR: 106.63 mL/min (ref >60.00)
GLUCOSE: 136 mg/dL — AB (ref 70–99)
Potassium: 3.7 mEq/L (ref 3.5–5.1)
SODIUM: 139 meq/L (ref 135–145)
TOTAL PROTEIN: 7.3 g/dL (ref 6.0–8.3)

## 2015-10-30 LAB — CBC WITH DIFFERENTIAL/PLATELET
BASOS ABS: 0 10*3/uL (ref 0.0–0.1)
Basophils Relative: 0.4 % (ref 0.0–3.0)
Eosinophils Absolute: 0.2 10*3/uL (ref 0.0–0.7)
Eosinophils Relative: 2.4 % (ref 0.0–5.0)
HCT: 36.4 % (ref 36.0–46.0)
Hemoglobin: 11.6 g/dL — ABNORMAL LOW (ref 12.0–15.0)
LYMPHS ABS: 2.5 10*3/uL (ref 0.7–4.0)
Lymphocytes Relative: 34.7 % (ref 12.0–46.0)
MCHC: 31.8 g/dL (ref 30.0–36.0)
MCV: 70.1 fl — AB (ref 78.0–100.0)
MONOS PCT: 8 % (ref 3.0–12.0)
Monocytes Absolute: 0.6 10*3/uL (ref 0.1–1.0)
NEUTROS PCT: 54.5 % (ref 43.0–77.0)
Neutro Abs: 3.9 10*3/uL (ref 1.4–7.7)
Platelets: 205 10*3/uL (ref 150.0–400.0)
RBC: 5.2 Mil/uL — AB (ref 3.87–5.11)
RDW: 15.5 % (ref 11.5–15.5)
WBC: 7.2 10*3/uL (ref 4.0–10.5)

## 2015-10-30 LAB — LIPID PANEL
Cholesterol: 92 mg/dL (ref 0–200)
HDL: 35.9 mg/dL — ABNORMAL LOW (ref >39.00)
LDL Cholesterol: 41 mg/dL (ref 0–99)
NONHDL: 56.52
Total CHOL/HDL Ratio: 3
Triglycerides: 77 mg/dL (ref 0.0–149.0)
VLDL: 15.4 mg/dL (ref 0.0–40.0)

## 2015-10-30 LAB — TSH: TSH: 3.34 u[IU]/mL (ref 0.35–4.50)

## 2015-11-06 ENCOUNTER — Encounter: Payer: 59 | Admitting: Family Medicine

## 2015-11-08 ENCOUNTER — Ambulatory Visit (INDEPENDENT_AMBULATORY_CARE_PROVIDER_SITE_OTHER): Payer: 59 | Admitting: Family Medicine

## 2015-11-08 ENCOUNTER — Encounter: Payer: Self-pay | Admitting: Family Medicine

## 2015-11-08 VITALS — BP 140/78 | HR 62 | Temp 98.0°F | Ht 65.75 in | Wt 194.2 lb

## 2015-11-08 DIAGNOSIS — M1990 Unspecified osteoarthritis, unspecified site: Secondary | ICD-10-CM

## 2015-11-08 DIAGNOSIS — E109 Type 1 diabetes mellitus without complications: Secondary | ICD-10-CM

## 2015-11-08 DIAGNOSIS — Z01419 Encounter for gynecological examination (general) (routine) without abnormal findings: Secondary | ICD-10-CM

## 2015-11-08 DIAGNOSIS — K219 Gastro-esophageal reflux disease without esophagitis: Secondary | ICD-10-CM | POA: Diagnosis not present

## 2015-11-08 DIAGNOSIS — I10 Essential (primary) hypertension: Secondary | ICD-10-CM | POA: Diagnosis not present

## 2015-11-08 DIAGNOSIS — Z0000 Encounter for general adult medical examination without abnormal findings: Secondary | ICD-10-CM | POA: Diagnosis not present

## 2015-11-08 DIAGNOSIS — E785 Hyperlipidemia, unspecified: Secondary | ICD-10-CM

## 2015-11-08 MED ORDER — AMLODIPINE BESYLATE 5 MG PO TABS: 5.0000 mg | ORAL_TABLET | Freq: Every day | ORAL | 3 refills | Status: DC

## 2015-11-08 MED ORDER — ATENOLOL 25 MG PO TABS: 25.0000 mg | ORAL_TABLET | Freq: Every day | ORAL | 3 refills | Status: DC

## 2015-11-08 MED ORDER — LOSARTAN POTASSIUM 100 MG PO TABS: 100.0000 mg | ORAL_TABLET | Freq: Every day | ORAL | 3 refills | Status: DC

## 2015-11-08 NOTE — Assessment & Plan Note (Signed)
At goal for a diabetic. No changes made today. 

## 2015-11-08 NOTE — Assessment & Plan Note (Signed)
Remains reasonably controlled.  NO changes made today.

## 2015-11-08 NOTE — Patient Instructions (Signed)
Great to see you. Have a happy birthday and say hi to your husband for me.

## 2015-11-08 NOTE — Progress Notes (Signed)
Pre visit review using our clinic review tool, if applicable. No additional management support is needed unless otherwise documented below in the visit note. 

## 2015-11-08 NOTE — Assessment & Plan Note (Signed)
Reviewed preventive care protocols, scheduled due services, and updated immunizations Discussed nutrition, exercise, diet, and healthy lifestyle.  

## 2015-11-08 NOTE — Assessment & Plan Note (Signed)
Followed by endo.  

## 2015-11-08 NOTE — Progress Notes (Signed)
Subjective:   Patient ID: Anna Wiggins, female    DOB: [birth year], 65 y.o.   MRN: [id number]  Anna Wiggins is a pleasant 65 y.o. year old female who presents to clinic today with Annual Exam and Headache and follow up of chronic medical conditions on 11/08/2015  HPI:   Colonoscopy 09/04/10- Dr. Sharlett Iles Zostavax 01/23/14 Pneumovax 01/23/14 Tdap 10/20/2013 Mammogram 05/14/15 Remote h/o hysterectomy-  Has GYN- Dr. Garwin Brothers. Has dermatologist- Dr. Phillip Heal - last seen 08/2014  Type 1 diabetes- followed by Dr. Gabriel Carina. Last seen in 09/2015, a1c was 7.6.  She increased her insulin.  Lab Results  Component Value Date   HGBA1C 7.9 (H) 10/23/2014     HTN.- Norvasc 5 mg daily, HCTZ 25 mg daily, Atenolol 50 mg daily and Cozaar 100 mg daily. Lab Results  Component Value Date   CREATININE 0.71 10/30/2015   HLD- taking zocor 40 mg daily.  At goal for a diabetic. Lab Results  Component Value Date   CHOL 92 10/30/2015   HDL 35.90 (L) 10/30/2015   LDLCALC 41 10/30/2015   TRIG 77.0 10/30/2015   CHOLHDL 3 10/30/2015   Lab Results  Component Value Date   ALT 20 10/30/2015   AST 15 10/30/2015   ALKPHOS 48 10/30/2015   BILITOT 0.4 10/30/2015   Lab Results  Component Value Date   WBC 7.2 10/30/2015   HGB 11.6 (L) 10/30/2015   HCT 36.4 10/30/2015   MCV 70.1 (L) 10/30/2015   PLT 205.0 10/30/2015   Current Outpatient Prescriptions on File Prior to Visit  Medication Sig Dispense Refill  . fish oil-omega-3 fatty acids 1000 MG capsule Take 1 g by mouth daily. 3 tablets daily     . hydrochlorothiazide (HYDRODIURIL) 25 MG tablet Take 1 tablet by mouth  daily 120 tablet 0  . ibuprofen (ADVIL,MOTRIN) 800 MG tablet Take 1 tablet (800 mg total) by mouth every 8 (eight) hours as needed. 30 tablet 1  . Insulin Glargine 300 UNIT/ML SOPN Inject 52 mg into the skin at bedtime.     . insulin glulisine (APIDRA) 100 UNIT/ML injection Inject into the skin. Inject 18 units before breakfast and 20  units before lunch and dinner    . Insulin Syringe-Needle U-100 (B-D INS SYR ULTRAFINE 1CC/30G) 30G X 1/2" 1 ML MISC USE AS DIRECTED 100 each 3  . Insulin Syringe-Needle U-100 30G X 1/2" 0.3 ML MISC Inject 1 Syringe as directed 2 (two) times daily. 200 each 3  . metFORMIN (GLUCOPHAGE) 500 MG tablet Take 1 tablet by mouth 2  times daily with a meal 180 tablet 3  . ONE TOUCH ULTRA TEST test strip USE 1 STRIP DAILY AS DIRECTED 100 each 2  . ONE TOUCH ULTRA TEST test strip Use every day 100 each 3  . potassium chloride SA (K-DUR,KLOR-CON) 20 MEQ tablet Take 1 tablet by mouth  daily 120 tablet 0  . simvastatin (ZOCOR) 40 MG tablet Take 1 tablet by mouth at  bedtime 120 tablet 0   Current Facility-Administered Medications on File Prior to Visit  Medication Dose Route Frequency Provider Last Rate Last Dose  . 0.9 %  sodium chloride infusion  500 mL Intravenous Continuous Sable Feil, MD        No Known Allergies  Past Medical History:  Diagnosis Date  . Cancer (HCC)    cervical cancer  . Diabetes mellitus   . GERD (gastroesophageal reflux disease)   . Hyperlipidemia   . Hypertension  Past Surgical History:  Procedure Laterality Date  . TONSILLECTOMY    . VAGINAL HYSTERECTOMY      Family History  Problem Relation Age of Onset  . Diabetes Mother   . Cancer Mother   . Diabetes Sister   . Diabetes Brother   . Cancer Maternal Aunt   . Heart disease Maternal Grandfather     Social History   Social History  . Marital status: Married    Spouse name: N/A  . Number of children: N/A  . Years of education: N/A   Occupational History  . Not on file.   Social History Main Topics  . Smoking status: Never Smoker  . Smokeless tobacco: Never Used  . Alcohol use No  . Drug use: No  . Sexual activity: Yes   Other Topics Concern  . Not on file   Social History Narrative  . No narrative on file   The PMH, PSH, Social History, Family History, Medications, and allergies  have been reviewed in [facility], and have been updated if relevant.   Review of Systems  Constitutional: Negative.   Respiratory: Negative.   Cardiovascular: Negative.   Gastrointestinal: Negative.   Endocrine: Negative.   Musculoskeletal: Negative.   Skin: Negative.   Allergic/Immunologic: Negative.   Neurological: Negative.   Hematological: Negative.   Psychiatric/Behavioral: Negative.   All other systems reviewed and are negative.      Objective:    BP 140/78   Pulse 62   Temp 98 F (36.7 C) (Oral)   Ht 5' 5.75" (1.67 m)   Wt 194 lb 4 oz (88.1 kg)   SpO2 98%   BMI 31.59 kg/m   Wt Readings from Last 3 Encounters:  11/08/15 194 lb 4 oz (88.1 kg)  08/09/15 192 lb 12 oz (87.4 kg)  01/08/15 190 lb 6.4 oz (86.4 kg)     Physical Exam  Constitutional: She is oriented to person, place, and time. She appears well-developed and well-nourished. No distress.  HENT:  Head: Normocephalic.  Eyes: Conjunctivae are normal.  Neck: Normal range of motion.  Cardiovascular: Normal rate and regular rhythm.   Pulmonary/Chest: Effort normal and breath sounds normal. No respiratory distress.  Abdominal: Soft.  Musculoskeletal: She exhibits no edema.  Neurological: She is alert and oriented to person, place, and time. No cranial nerve deficit.  Skin: Skin is warm and dry.  Psychiatric: She has a normal mood and affect. Her behavior is normal. Judgment and thought content normal.  Nursing note and vitals reviewed.         Assessment & Plan:   Well woman exam  Osteoarthritis, unspecified osteoarthritis type, unspecified site  Essential hypertension - Plan: losartan (COZAAR) 100 MG tablet  Gastroesophageal reflux disease, esophagitis presence not specified No Follow-up on file.

## 2016-01-21 ENCOUNTER — Other Ambulatory Visit: Payer: Self-pay | Admitting: Family Medicine

## 2016-01-30 ENCOUNTER — Ambulatory Visit (INDEPENDENT_AMBULATORY_CARE_PROVIDER_SITE_OTHER): Payer: 59

## 2016-01-30 DIAGNOSIS — Z23 Encounter for immunization: Secondary | ICD-10-CM

## 2016-02-11 ENCOUNTER — Other Ambulatory Visit: Payer: Self-pay | Admitting: Family Medicine

## 2016-03-18 ENCOUNTER — Encounter: Payer: Self-pay | Admitting: Family Medicine

## 2016-03-18 LAB — HM DIABETES EYE EXAM

## 2016-04-07 ENCOUNTER — Other Ambulatory Visit: Payer: Self-pay | Admitting: Family Medicine

## 2016-05-16 ENCOUNTER — Encounter: Payer: Self-pay | Admitting: Family Medicine

## 2016-08-13 ENCOUNTER — Other Ambulatory Visit: Payer: Self-pay | Admitting: Family Medicine

## 2016-08-13 DIAGNOSIS — I10 Essential (primary) hypertension: Secondary | ICD-10-CM

## 2016-10-29 ENCOUNTER — Other Ambulatory Visit: Payer: Self-pay | Admitting: Family Medicine

## 2016-10-29 DIAGNOSIS — E785 Hyperlipidemia, unspecified: Secondary | ICD-10-CM

## 2016-10-29 DIAGNOSIS — Z01419 Encounter for gynecological examination (general) (routine) without abnormal findings: Secondary | ICD-10-CM

## 2016-10-29 DIAGNOSIS — E109 Type 1 diabetes mellitus without complications: Secondary | ICD-10-CM

## 2016-11-03 ENCOUNTER — Other Ambulatory Visit (INDEPENDENT_AMBULATORY_CARE_PROVIDER_SITE_OTHER): Payer: 59

## 2016-11-03 DIAGNOSIS — Z01419 Encounter for gynecological examination (general) (routine) without abnormal findings: Secondary | ICD-10-CM | POA: Diagnosis not present

## 2016-11-03 DIAGNOSIS — E109 Type 1 diabetes mellitus without complications: Secondary | ICD-10-CM | POA: Diagnosis not present

## 2016-11-03 LAB — COMPREHENSIVE METABOLIC PANEL
ALK PHOS: 49 U/L (ref 39–117)
ALT: 21 U/L (ref 0–35)
AST: 15 U/L (ref 0–37)
Albumin: 4 g/dL (ref 3.5–5.2)
BUN: 12 mg/dL (ref 6–23)
CHLORIDE: 102 meq/L (ref 96–112)
CO2: 31 mEq/L (ref 19–32)
Calcium: 9 mg/dL (ref 8.4–10.5)
Creatinine, Ser: 0.73 mg/dL (ref 0.40–1.20)
GFR: 102.93 mL/min (ref >60.00)
GLUCOSE: 131 mg/dL — AB (ref 70–99)
POTASSIUM: 3.3 meq/L — AB (ref 3.5–5.1)
SODIUM: 141 meq/L (ref 135–145)
TOTAL PROTEIN: 7 g/dL (ref 6.0–8.3)
Total Bilirubin: 0.4 mg/dL (ref 0.2–1.2)

## 2016-11-03 LAB — CBC WITH DIFFERENTIAL/PLATELET
BASOS ABS: 0 10*3/uL (ref 0.0–0.1)
BASOS PCT: 0.5 % (ref 0.0–3.0)
Eosinophils Absolute: 0.2 10*3/uL (ref 0.0–0.7)
Eosinophils Relative: 2.9 % (ref 0.0–5.0)
HCT: 37.3 % (ref 36.0–46.0)
HEMOGLOBIN: 11.8 g/dL — AB (ref 12.0–15.0)
LYMPHS PCT: 25.9 % (ref 12.0–46.0)
Lymphs Abs: 2 10*3/uL (ref 0.7–4.0)
MCHC: 31.6 g/dL (ref 30.0–36.0)
MCV: 71.1 fl — ABNORMAL LOW (ref 78.0–100.0)
MONOS PCT: 7.7 % (ref 3.0–12.0)
Monocytes Absolute: 0.6 10*3/uL (ref 0.1–1.0)
NEUTROS ABS: 4.9 10*3/uL (ref 1.4–7.7)
NEUTROS PCT: 63 % (ref 43.0–77.0)
Platelets: 178 10*3/uL (ref 150.0–400.0)
RBC: 5.24 Mil/uL — ABNORMAL HIGH (ref 3.87–5.11)
RDW: 15 % (ref 11.5–15.5)
WBC: 7.8 10*3/uL (ref 4.0–10.5)

## 2016-11-03 LAB — LIPID PANEL
Cholesterol: 99 mg/dL (ref 0–200)
HDL: 37.3 mg/dL — ABNORMAL LOW (ref >39.00)
LDL CALC: 48 mg/dL (ref 0–99)
NONHDL: 62.01
Total CHOL/HDL Ratio: 3
Triglycerides: 71 mg/dL (ref 0.0–149.0)
VLDL: 14.2 mg/dL (ref 0.0–40.0)

## 2016-11-03 LAB — HEMOGLOBIN A1C: Hgb A1c MFr Bld: 7 % — ABNORMAL HIGH (ref 4.6–6.5)

## 2016-11-03 LAB — TSH: TSH: 2.76 u[IU]/mL (ref 0.35–4.50)

## 2016-11-04 LAB — HEPATITIS C ANTIBODY: HCV Ab: NEGATIVE

## 2016-11-10 ENCOUNTER — Ambulatory Visit (INDEPENDENT_AMBULATORY_CARE_PROVIDER_SITE_OTHER): Payer: 59 | Admitting: Family Medicine

## 2016-11-10 ENCOUNTER — Encounter: Payer: Self-pay | Admitting: Family Medicine

## 2016-11-10 VITALS — BP 148/80 | HR 67 | Temp 98.9°F | Ht 65.75 in | Wt 183.8 lb

## 2016-11-10 DIAGNOSIS — Z0000 Encounter for general adult medical examination without abnormal findings: Secondary | ICD-10-CM

## 2016-11-10 DIAGNOSIS — E785 Hyperlipidemia, unspecified: Secondary | ICD-10-CM | POA: Diagnosis not present

## 2016-11-10 DIAGNOSIS — I10 Essential (primary) hypertension: Secondary | ICD-10-CM

## 2016-11-10 DIAGNOSIS — Z01419 Encounter for gynecological examination (general) (routine) without abnormal findings: Secondary | ICD-10-CM

## 2016-11-10 DIAGNOSIS — E109 Type 1 diabetes mellitus without complications: Secondary | ICD-10-CM

## 2016-11-10 DIAGNOSIS — M1990 Unspecified osteoarthritis, unspecified site: Secondary | ICD-10-CM

## 2016-11-10 DIAGNOSIS — K219 Gastro-esophageal reflux disease without esophagitis: Secondary | ICD-10-CM

## 2016-11-10 NOTE — Progress Notes (Signed)
Subjective:   Patient ID: Anna Wiggins, female    DOB: [birth year], 66 y.o.   MRN: [id number]  Anna Wiggins is a pleasant 66 y.o. year old female who presents to clinic today with Annual Exam and follow up of chronic medical conditions on 11/10/2016  HPI:   Colonoscopy 09/04/10- Dr. Sharlett Iles Zostavax 01/23/14 Pneumovax 01/23/14 Tdap 10/20/2013 Mammogram 05/14/16  Remote h/o hysterectomy-  Has GYN- Dr. Garwin Brothers.   Type 1 diabetes- followed by Dr. Gabriel Carina. Last seen in 09/17/16- note reviewed. Has been working on diet- joined [employer].  Walking 3-4 times a week.  Has a 10K daily step goal. Plan as follows:  Type 2 diabetes mellitus, with long-term current use of insulin - Congratulated her that she is doing well with diabetes control. - Continue Toujeo 50 units daily in the mornings. - Adjust Apidra to: 18 units before breakfast, 8 units before lunch, and 14 units before supper  Lab Results  Component Value Date   HGBA1C 7.0 (H) 11/03/2016     HTN  Norvasc 5 mg daily, HCTZ 25 mg daily, Atenolol 50 mg daily and Cozaar 100 mg daily. Lab Results  Component Value Date   CREATININE 0.73 11/03/2016   HLD- taking zocor 40 mg daily.  At goal for a diabetic. Lab Results  Component Value Date   CHOL 99 11/03/2016   HDL 37.30 (L) 11/03/2016   LDLCALC 48 11/03/2016   TRIG 71.0 11/03/2016   CHOLHDL 3 11/03/2016   Lab Results  Component Value Date   ALT 21 11/03/2016   AST 15 11/03/2016   ALKPHOS 49 11/03/2016   BILITOT 0.4 11/03/2016   Lab Results  Component Value Date   WBC 7.8 11/03/2016   HGB 11.8 (L) 11/03/2016   HCT 37.3 11/03/2016   MCV 71.1 (L) 11/03/2016   PLT 178.0 11/03/2016   Current Outpatient Prescriptions on File Prior to Visit  Medication Sig Dispense Refill  . amLODipine (NORVASC) 5 MG tablet TAKE 1 TABLET BY MOUTH  DAILY 90 tablet 0  . atenolol (TENORMIN) 25 MG tablet TAKE 1 TABLET BY MOUTH  DAILY 90 tablet 0  . fish oil-omega-3 fatty acids  1000 MG capsule Take 1 g by mouth daily. 3 tablets daily     . hydrochlorothiazide (HYDRODIURIL) 25 MG tablet TAKE 1 TABLET BY MOUTH  DAILY 90 tablet 2  . ibuprofen (ADVIL,MOTRIN) 800 MG tablet Take 1 tablet (800 mg total) by mouth every 8 (eight) hours as needed. 30 tablet 1  . Insulin Glargine 300 UNIT/ML SOPN Inject 50 mg into the skin every morning.     . insulin glulisine (APIDRA) 100 UNIT/ML injection Inject into the skin. Inject 22 units before breakfast and 8 units before lunch and 12 units at dinner    . Insulin Syringe-Needle U-100 (B-D INS SYR ULTRAFINE 1CC/30G) 30G X 1/2" 1 ML MISC USE AS DIRECTED 100 each 3  . Insulin Syringe-Needle U-100 30G X 1/2" 0.3 ML MISC Inject 1 Syringe as directed 2 (two) times daily. 200 each 3  . losartan (COZAAR) 100 MG tablet TAKE 1 TABLET BY MOUTH  DAILY 90 tablet 0  . metFORMIN (GLUCOPHAGE) 500 MG tablet Take 1 tablet by mouth 2  times daily with a meal 180 tablet 3  . ONE TOUCH ULTRA TEST test strip USE 1 STRIP DAILY AS DIRECTED 100 each 2  . ONE TOUCH ULTRA TEST test strip Use every day 100 each 3  . potassium chloride SA (K-DUR,KLOR-CON) 20 MEQ  tablet TAKE 1 TABLET BY MOUTH  DAILY 120 tablet 2  . simvastatin (ZOCOR) 40 MG tablet TAKE 1 TABLET BY MOUTH AT  BEDTIME 90 tablet 0   Current Facility-Administered Medications on File Prior to Visit  Medication Dose Route Frequency Provider Last Rate Last Dose  . 0.9 %  sodium chloride infusion  500 mL Intravenous Continuous Sable Feil, MD        No Known Allergies  Past Medical History:  Diagnosis Date  . Cancer (HCC)    cervical cancer  . Diabetes mellitus   . GERD (gastroesophageal reflux disease)   . Hyperlipidemia   . Hypertension     Past Surgical History:  Procedure Laterality Date  . TONSILLECTOMY    . VAGINAL HYSTERECTOMY      Family History  Problem Relation Age of Onset  . Diabetes Mother   . Cancer Mother   . Diabetes Sister   . Diabetes Brother   . Cancer Maternal  Aunt   . Heart disease Maternal Grandfather     Social History   Social History  . Marital status: Married    Spouse name: N/A  . Number of children: N/A  . Years of education: N/A   Occupational History  . Not on file.   Social History Main Topics  . Smoking status: Never Smoker  . Smokeless tobacco: Never Used  . Alcohol use No  . Drug use: No  . Sexual activity: Yes   Other Topics Concern  . Not on file   Social History Narrative  . No narrative on file   The PMH, PSH, Social History, Family History, Medications, and allergies have been reviewed in [facility], and have been updated if relevant.   Review of Systems  Constitutional: Negative.   Respiratory: Negative.   Cardiovascular: Negative.   Gastrointestinal: Negative.   Endocrine: Negative.   Musculoskeletal: Negative.   Skin: Negative.   Allergic/Immunologic: Negative.   Neurological: Negative.   Hematological: Negative.   Psychiatric/Behavioral: Negative.   All other systems reviewed and are negative.      Objective:    BP (!) 160/78   Pulse 67   Temp 98.9 F (37.2 C) (Oral)   Ht 5' 5.75" (1.67 m)   Wt 183 lb 12 oz (83.3 kg)   BMI 29.88 kg/m   Wt Readings from Last 3 Encounters:  11/10/16 183 lb 12 oz (83.3 kg)  11/08/15 194 lb 4 oz (88.1 kg)  08/09/15 192 lb 12 oz (87.4 kg)     Physical Exam  Constitutional: She is oriented to person, place, and time. She appears well-developed and well-nourished. No distress.  HENT:  Head: Normocephalic and atraumatic.  Right Ear: External ear normal.  Left Ear: External ear normal.  Eyes: Conjunctivae are normal.  Neck: Normal range of motion.  Cardiovascular: Normal rate, regular rhythm and normal heart sounds.   Pulmonary/Chest: Effort normal and breath sounds normal. No respiratory distress.  Abdominal: Soft.  Musculoskeletal: Normal range of motion. She exhibits no edema.  Neurological: She is alert and oriented to person, place, and time. No  cranial nerve deficit.  Skin: Skin is warm and dry. She is not diaphoretic.  Psychiatric: She has a normal mood and affect. Her behavior is normal. Judgment and thought content normal.  Nursing note and vitals reviewed.      Assessment & Plan:   Well woman exam  Osteoarthritis, unspecified osteoarthritis type, unspecified site  Gastroesophageal reflux disease, esophagitis presence not specified  Essential  hypertension  Hyperlipidemia, unspecified hyperlipidemia type  Type 1 diabetes mellitus without complication (HCC) No Follow-up on file.

## 2016-11-10 NOTE — Assessment & Plan Note (Signed)
Reviewed preventive care protocols, scheduled due services, and updated immunizations Discussed nutrition, exercise, diet, and healthy lifestyle.  

## 2016-11-10 NOTE — Assessment & Plan Note (Signed)
Continue current rxs. 

## 2016-11-10 NOTE — Assessment & Plan Note (Signed)
Improving a1c with diet and weight loss! Also followed by endo. No changes made to rxs today.

## 2016-11-10 NOTE — Assessment & Plan Note (Signed)
At goal for a diabetic. Continue current dose of statin. No changes made today.

## 2016-12-13 ENCOUNTER — Other Ambulatory Visit: Payer: Self-pay | Admitting: Family Medicine

## 2016-12-27 ENCOUNTER — Other Ambulatory Visit: Payer: Self-pay | Admitting: Family Medicine

## 2016-12-27 DIAGNOSIS — I10 Essential (primary) hypertension: Secondary | ICD-10-CM

## 2016-12-30 ENCOUNTER — Encounter: Payer: Self-pay | Admitting: Family Medicine

## 2016-12-30 MED ORDER — ATENOLOL 25 MG PO TABS: 25.0000 mg | ORAL_TABLET | Freq: Every day | ORAL | 3 refills | Status: DC

## 2016-12-30 MED ORDER — LOSARTAN POTASSIUM 100 MG PO TABS
100.0000 mg | ORAL_TABLET | Freq: Every day | ORAL | 3 refills | Status: DC
Start: 2016-12-30 — End: 2023-01-05

## 2016-12-30 MED ORDER — AMLODIPINE BESYLATE 5 MG PO TABS: 5.0000 mg | ORAL_TABLET | Freq: Every day | ORAL | 3 refills | Status: AC

## 2016-12-30 NOTE — Addendum Note (Signed)
Addended by: Pilar Grammes on: 12/30/2016 04:11 PM   Modules accepted: Orders

## 2017-01-04 ENCOUNTER — Other Ambulatory Visit: Payer: Self-pay | Admitting: Family Medicine

## 2017-01-05 MED ORDER — HYDROCHLOROTHIAZIDE 25 MG PO TABS: 25.0000 mg | ORAL_TABLET | Freq: Every day | ORAL | 2 refills | Status: DC

## 2017-01-05 NOTE — Addendum Note (Signed)
Addended by: Tammi Sou on: 01/05/2017 10:24 AM   Modules accepted: Orders

## 2017-01-29 ENCOUNTER — Ambulatory Visit (INDEPENDENT_AMBULATORY_CARE_PROVIDER_SITE_OTHER): Payer: Medicare Other

## 2017-01-29 DIAGNOSIS — Z23 Encounter for immunization: Secondary | ICD-10-CM | POA: Diagnosis not present

## 2017-04-24 ENCOUNTER — Other Ambulatory Visit: Payer: Self-pay | Admitting: Family Medicine

## 2017-09-27 ENCOUNTER — Other Ambulatory Visit: Payer: Self-pay

## 2017-09-27 ENCOUNTER — Emergency Department
Admission: EM | Admit: 2017-09-27 | Discharge: 2017-09-27 | Disposition: A | Discharge status: Home / Self Care | Payer: Medicare Other | Attending: Emergency Medicine | Admitting: Emergency Medicine

## 2017-09-27 ENCOUNTER — Emergency Department: Payer: Medicare Other

## 2017-09-27 DIAGNOSIS — R058 Other specified cough: Secondary | ICD-10-CM

## 2017-09-27 DIAGNOSIS — Z794 Long term (current) use of insulin: Secondary | ICD-10-CM | POA: Insufficient documentation

## 2017-09-27 DIAGNOSIS — Z8541 Personal history of malignant neoplasm of cervix uteri: Secondary | ICD-10-CM | POA: Insufficient documentation

## 2017-09-27 DIAGNOSIS — I10 Essential (primary) hypertension: Secondary | ICD-10-CM | POA: Insufficient documentation

## 2017-09-27 DIAGNOSIS — E109 Type 1 diabetes mellitus without complications: Secondary | ICD-10-CM | POA: Insufficient documentation

## 2017-09-27 DIAGNOSIS — Z79899 Other long term (current) drug therapy: Secondary | ICD-10-CM | POA: Diagnosis not present

## 2017-09-27 DIAGNOSIS — R05 Cough: Secondary | ICD-10-CM | POA: Insufficient documentation

## 2017-09-27 DIAGNOSIS — R059 Cough, unspecified: Secondary | ICD-10-CM

## 2017-09-27 LAB — COMPREHENSIVE METABOLIC PANEL
ALT: 19 U/L (ref 14–54)
AST: 23 U/L (ref 15–41)
Albumin: 3.4 g/dL — ABNORMAL LOW (ref 3.5–5.0)
Alkaline Phosphatase: 48 U/L (ref 38–126)
Anion gap: 10 (ref 5–15)
BILIRUBIN TOTAL: 0.8 mg/dL (ref 0.3–1.2)
BUN: 16 mg/dL (ref 6–20)
CO2: 25 mmol/L (ref 22–32)
CREATININE: 0.91 mg/dL (ref 0.44–1.00)
Calcium: 8.5 mg/dL — ABNORMAL LOW (ref 8.9–10.3)
Chloride: 99 mmol/L — ABNORMAL LOW (ref 101–111)
GFR calc Af Amer: >60 mL/min (ref >60)
Glucose, Bld: 127 mg/dL — ABNORMAL HIGH (ref 65–99)
Potassium: 3.3 mmol/L — ABNORMAL LOW (ref 3.5–5.1)
Sodium: 134 mmol/L — ABNORMAL LOW (ref 135–145)
TOTAL PROTEIN: 7.6 g/dL (ref 6.5–8.1)

## 2017-09-27 LAB — CBC WITH DIFFERENTIAL/PLATELET
Basophils Absolute: 0.1 10*3/uL (ref 0–0.1)
Basophils Relative: 1 %
Eosinophils Absolute: 0.3 10*3/uL (ref 0–0.7)
Eosinophils Relative: 3 %
HCT: 35.6 % (ref 35.0–47.0)
HEMOGLOBIN: 11.4 g/dL — AB (ref 12.0–16.0)
LYMPHS ABS: 1.7 10*3/uL (ref 1.0–3.6)
Lymphocytes Relative: 17 %
MCH: 22.6 pg — ABNORMAL LOW (ref 26.0–34.0)
MCHC: 32.1 g/dL (ref 32.0–36.0)
MCV: 70.4 fL — AB (ref 80.0–100.0)
Monocytes Absolute: 1.1 10*3/uL — ABNORMAL HIGH (ref 0.2–0.9)
Monocytes Relative: 11 %
Neutro Abs: 7 10*3/uL — ABNORMAL HIGH (ref 1.4–6.5)
Neutrophils Relative %: 68 %
Platelets: 170 10*3/uL (ref 150–440)
RBC: 5.06 MIL/uL (ref 3.80–5.20)
RDW: 15 % — AB (ref 11.5–14.5)
WBC: 10.2 10*3/uL (ref 3.6–11.0)

## 2017-09-27 LAB — TROPONIN I: Troponin I: <0.03 ng/mL (ref <0.03)

## 2017-09-27 MED ORDER — BENZONATATE 100 MG PO CAPS: 100.0000 mg | ORAL_CAPSULE | Freq: Three times a day (TID) | ORAL | 0 refills | Status: AC

## 2017-09-27 MED ORDER — ALBUTEROL SULFATE HFA 108 (90 BASE) MCG/ACT IN AERS: 2.0000 | INHALATION_SPRAY | Freq: Four times a day (QID) | RESPIRATORY_TRACT | 2 refills | Status: DC | PRN

## 2017-09-27 MED ORDER — IPRATROPIUM-ALBUTEROL 0.5-2.5 (3) MG/3ML IN SOLN
3.0000 mL | Freq: Once | RESPIRATORY_TRACT | Status: AC
  Administered 2017-09-27: 3 mL via RESPIRATORY_TRACT
  Filled 2017-09-27: qty 3

## 2017-09-27 MED ORDER — BENZONATATE 100 MG PO CAPS
200.0000 mg | ORAL_CAPSULE | Freq: Once | ORAL | Status: AC
  Administered 2017-09-27: 200 mg via ORAL
  Filled 2017-09-27: qty 2

## 2017-09-27 MED ORDER — LIDOCAINE HCL (PF) 1 % IJ SOLN
INTRAMUSCULAR | Status: AC
  Administered 2017-09-27: 15:00:00
  Filled 2017-09-27: qty 5

## 2017-09-27 NOTE — ED Provider Notes (Signed)
[facility] Emergency Department Provider Note   ____________________________________________   First MD Initiated Contact with Patient 09/27/17 1330     (approximate)  I have reviewed the triage vital signs and the nursing notes.   HISTORY  Chief Complaint Cough   HPI Anna Wiggins is a 67 y.o. female who reports a nonproductive cough for several days she has had a fever last night she soaked the bed with sweat.  This morning she is coughing some more had an episode of very hard and frequent coughing while she was sitting down passed out fell out of the chair that she was sitting in and busted her lip on the ground.  She complains of some pain in the lip otherwise a little bit of achiness throughout the ribs from coughing so much that was present before she passed out.  Her cough is nonproductive she has a low-grade fever of 100.8 her EMS.  She is otherwise okay.  Patient denies any shortness of breath.  She does not have any pleuritic type of pain.  She just has a bad cough.   Past Medical History:  Diagnosis Date  . Cancer (HCC)    cervical cancer  . Diabetes mellitus   . GERD (gastroesophageal reflux disease)   . Hyperlipidemia   . Hypertension     Patient Active Problem List   Diagnosis Date Noted  . Well woman exam 11/02/2014  . Type 1 diabetes mellitus ([place]) 12/21/2007  . HLD (hyperlipidemia) 09/14/2007  . Essential hypertension 09/14/2007  . ALLERGIC RHINITIS 09/11/2006  . GERD 09/11/2006  . Osteoarthritis 09/11/2006    Past Surgical History:  Procedure Laterality Date  . TONSILLECTOMY    . VAGINAL HYSTERECTOMY      Prior to Admission medications   Medication Sig Start Date End Date Taking? Authorizing Provider  albuterol (PROVENTIL HFA;VENTOLIN HFA) 108 (90 Base) MCG/ACT inhaler Inhale 2 puffs into the lungs every 6 (six) hours as needed for wheezing or shortness of breath. 09/27/17   Nena Polio, MD  amLODipine (NORVASC) 5  MG tablet Take 1 tablet (5 mg total) by mouth daily. 12/30/16   Lucille Passy, MD  atenolol (TENORMIN) 25 MG tablet Take 1 tablet (25 mg total) by mouth daily. 12/30/16   Lucille Passy, MD  B-D UF III MINI PEN NEEDLES 31G X 5 MM MISC USE 4 DAILY 09/01/16   [provider]  benzonatate (TESSALON PERLES) 100 MG capsule Take 1 capsule (100 mg total) by mouth 3 (three) times daily. 09/27/17 09/27/18  Nena Polio, MD  fish oil-omega-3 fatty acids 1000 MG capsule Take 1 g by mouth daily. 3 tablets daily     [provider]  hydrochlorothiazide (HYDRODIURIL) 25 MG tablet Take 1 tablet (25 mg total) by mouth daily. 01/05/17   Lucille Passy, MD  ibuprofen (ADVIL,MOTRIN) 800 MG tablet Take 1 tablet (800 mg total) by mouth every 8 (eight) hours as needed. 09/19/14   Jearld Fenton, NP  Insulin Glargine 300 UNIT/ML SOPN Inject 50 mg into the skin every morning.     [provider]  insulin glulisine (APIDRA) 100 UNIT/ML injection Inject into the skin. Inject 22 units before breakfast and 8 units before lunch and 12 units at dinner    [provider]  Insulin Syringe-Needle U-100 (B-D INS SYR ULTRAFINE 1CC/30G) 30G X 1/2" 1 ML MISC USE AS DIRECTED 10/20/13   Dorena Cookey, MD  Insulin Syringe-Needle U-100 30G X  1/2" 0.3 ML MISC Inject 1 Syringe as directed 2 (two) times daily. 10/24/11   Dorena Cookey, MD  losartan (COZAAR) 100 MG tablet Take 1 tablet (100 mg total) by mouth daily. 12/30/16   Lucille Passy, MD  metFORMIN (GLUCOPHAGE) 500 MG tablet Take 1 tablet by mouth 2  times daily with a meal 10/20/13   Dorena Cookey, MD  ONE [facility] ULTRA TEST test strip USE 1 STRIP DAILY AS DIRECTED 05/01/14   Dorena Cookey, MD  ONE [facility] ULTRA TEST test strip Use every day 06/06/14   Dorena Cookey, MD  potassium chloride SA (K-DUR,KLOR-CON) 20 MEQ tablet TAKE 1 TABLET BY MOUTH  DAILY 04/08/16   Lucille Passy, MD  simvastatin (ZOCOR) 40 MG tablet TAKE 1 TABLET BY MOUTH AT  BEDTIME 04/24/17    Lucille Passy, MD    Allergies Patient has no known allergies.  Family History  Problem Relation Age of Onset  . Diabetes Mother   . Cancer Mother   . Diabetes Sister   . Diabetes Brother   . Cancer Maternal Aunt   . Heart disease Maternal Grandfather     Social History Social History   Tobacco Use  . Smoking status: Never Smoker  . Smokeless tobacco: Never Used  Substance Use Topics  . Alcohol use: No  . Drug use: No    Review of Systems  Constitutional:  fever Eyes: No visual changes. ENT: No sore throat. Cardiovascular: See HPI Respiratory: Denies shortness of breath. Gastrointestinal: No abdominal pain.  No nausea, no vomiting.  No diarrhea.  No constipation. Genitourinary: Negative for dysuria. Musculoskeletal: Negative for back pain. Skin: Negative for rash. Neurological: Negative for headaches, focal weakness  ____________________________________________   PHYSICAL EXAM:  VITAL SIGNS: ED Triage Vitals  Enc Vitals Group     BP --      Pulse --      Resp --      Temp --      Temp src --      SpO2 09/27/17 1328 98 %     Weight 09/27/17 1330 190 lb (86.2 kg)     Height 09/27/17 1330 5\' 6"  (1.676 m)     Head Circumference --      Peak Flow --      Pain Score 09/27/17 1330 0     Pain Loc --      Pain Edu? --      Excl. in [place]? --     Constitutional: Alert and oriented. Well appearing and in no acute distress. Eyes: Conjunctivae are normal.  Head: Atraumatic. Nose: No congestion/rhinnorhea. Mouth/Throat: Mucous membranes are moist.  Oropharynx non-erythematous.  Patient has about a three-quarter centimeter long vertical laceration in the inner side of the lip spot halfway through the lip. Neck: No stridor.   Cardiovascular: Normal rate, regular rhythm. Grossly normal heart sounds.  Good peripheral circulation. Respiratory: Normal respiratory effort.  No retractions. Lungs CTAB. Gastrointestinal: Soft and nontender. No distention. No abdominal  bruits. No CVA tenderness. Musculoskeletal: No lower extremity tenderness nor edema.   Neurologic:  Normal speech and language. No gross focal neurologic deficits are appreciated. No gait instability. Skin:  Skin is warm, dry and intact. No rash noted. Psychiatric: Mood and affect are normal. Speech and behavior are normal.  ____________________________________________   LABS (all labs ordered are listed, but only abnormal results are displayed)  Labs Reviewed  COMPREHENSIVE METABOLIC PANEL - Abnormal; Notable for the following components:  Result Value   Sodium 134 (*)    Potassium 3.3 (*)    Chloride 99 (*)    Glucose, Bld 127 (*)    Calcium 8.5 (*)    Albumin 3.4 (*)    All other components within normal limits  CBC WITH DIFFERENTIAL/PLATELET - Abnormal; Notable for the following components:   Hemoglobin 11.4 (*)    MCV 70.4 (*)    MCH 22.6 (*)    RDW 15.0 (*)    Neutro Abs 7.0 (*)    Monocytes Absolute 1.1 (*)    All other components within normal limits  TROPONIN I   ____________________________________________  EKG  EKG read interpreted by me shows sinus rhythm rate of 69 normal axis nonspecific ST-T wave changes ____________________________________________  RADIOLOGY  ED MD interpretation: Chest x-ray read by radiology as no acute disease  Official radiology report(s): Dg Chest 2 View  Result Date: 09/27/2017 CLINICAL DATA:  Non smoker with cough all week, today was coughing very hard and had a syncopal episode. No hx of heart/lung disease. EXAM: CHEST - 2 VIEW COMPARISON:  None. FINDINGS: Right hemidiaphragm elevation is mild. Lateral view degraded by patient arm position. Midline trachea.  Normal heart size and mediastinal contours. Sharp costophrenic angles.  No pneumothorax.  Clear lungs. IMPRESSION: No active cardiopulmonary disease. Electronically Signed   By: Abigail Miyamoto M.D.   On: 09/27/2017 14:14     ____________________________________________   PROCEDURES  Procedure(s) performed: Patient's lip was irrigated briefly with saline then anesthetized with 1% lidocaine with epi irrigated more forcefully with saline and then wound was closed with 2 stitches of 3-0 silk.  Patient tolerated very well. Procedures  Critical Care performed:   ____________________________________________   INITIAL IMPRESSION / ASSESSMENT AND PLAN / ED COURSE  Patient's cough is much better after the Tessalon.  She reiterates that she is not short of breath and does not have any pleuritic type of pain.  Her lab work is essentially negative chest x-ray is essentially negative per radiology in my review patient has no edema lungs are clear she has had no problems except for the cough which is decreased in intensity since she is been here.  I will discharge her with some Tessalon and an albuterol inhaler.  She will return here if she is worse at all follow-up with her regular doctor.         ____________________________________________   FINAL CLINICAL IMPRESSION(S) / ED DIAGNOSES  Final diagnoses:  Cough  Post-tussive syncope     ED Discharge Orders        Ordered    benzonatate (TESSALON PERLES) 100 MG capsule  3 times daily     09/27/17 1807    albuterol (PROVENTIL HFA;VENTOLIN HFA) 108 (90 Base) MCG/ACT inhaler  Every 6 hours PRN     09/27/17 1807       Note:  This document was prepared using Dragon voice recognition software and may include unintentional dictation errors.    Nena Polio, MD 09/27/17 [phone]

## 2017-09-27 NOTE — ED Notes (Signed)
Pt reports that cough has improved with medication

## 2017-09-27 NOTE — ED Notes (Signed)
Pt had episode of coughing that caused her to "lose her breath" - talked to pt and had her to take slow deep breaths and try to relax - respirations even/unlabored and O2 sat 98% on RA

## 2017-09-27 NOTE — ED Triage Notes (Signed)
Pt arrived via ems for c/o cough (non-productive) - the pt had a "coughing spell" and then passed out (possibly vagal down) and fell into floor from the chair causing laceration to lip - pt has had cough since Thursday

## 2017-09-27 NOTE — Discharge Instructions (Signed)
Use the [employer] as directed.  You can use the inhaler as we discussed 2 puffs 4 times a day as long as it helps.  Please return here if you do get short of breath, if you have pain on deep breathing worse fever or if your cough is not any better in 2 or 3 more days.

## 2017-11-09 ENCOUNTER — Other Ambulatory Visit: Payer: 59

## 2017-11-12 ENCOUNTER — Encounter: Payer: 59 | Admitting: Family Medicine

## 2017-12-23 ENCOUNTER — Other Ambulatory Visit: Payer: Self-pay | Admitting: Family Medicine

## 2017-12-23 NOTE — Telephone Encounter (Signed)
Pt must sched appt first/not seen by TA since 7.2018/thx dmf

## 2018-08-03 IMAGING — CR DG CHEST 2V
2 series · 2 of 2 positions shown · non-contrast
Comparison: None.

CLINICAL DATA: Non smoker with cough all week, today was coughing
very hard and had a syncopal episode. No hx of heart/lung disease.

EXAM:
CHEST - 2 VIEW

[chest pa]
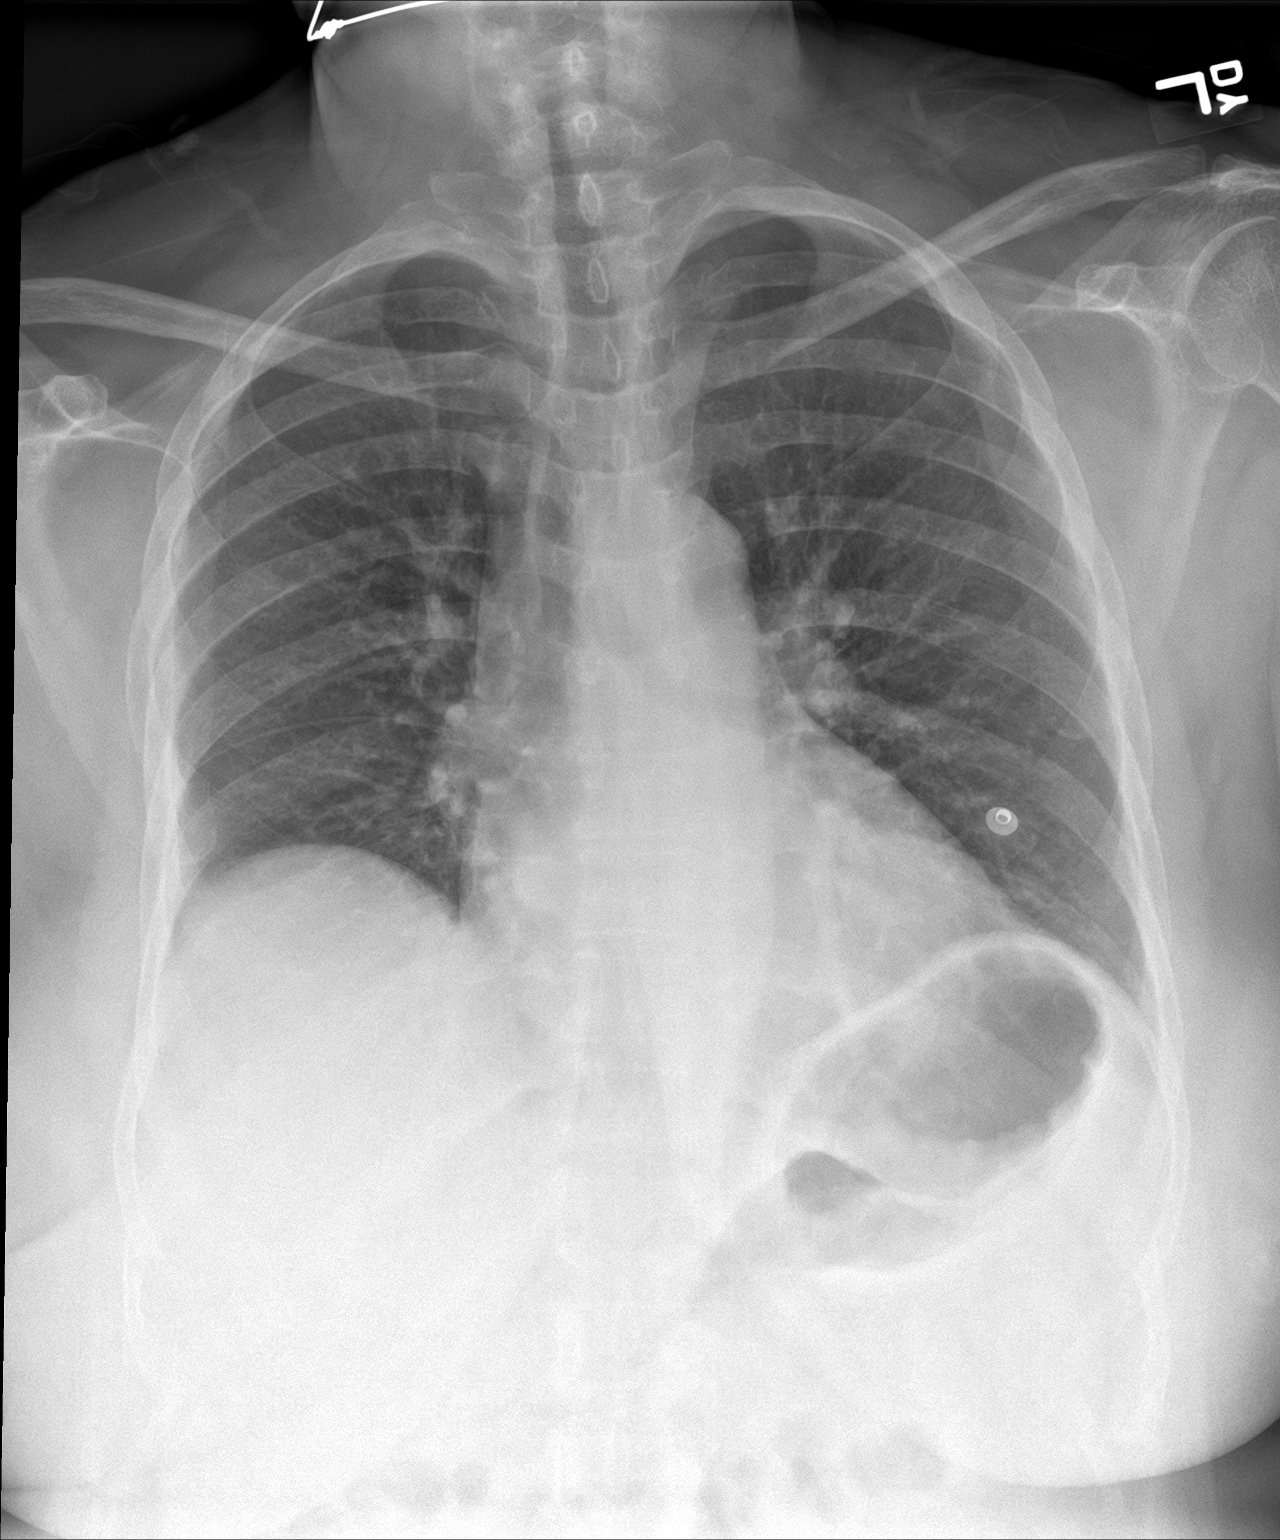

[chest lat]
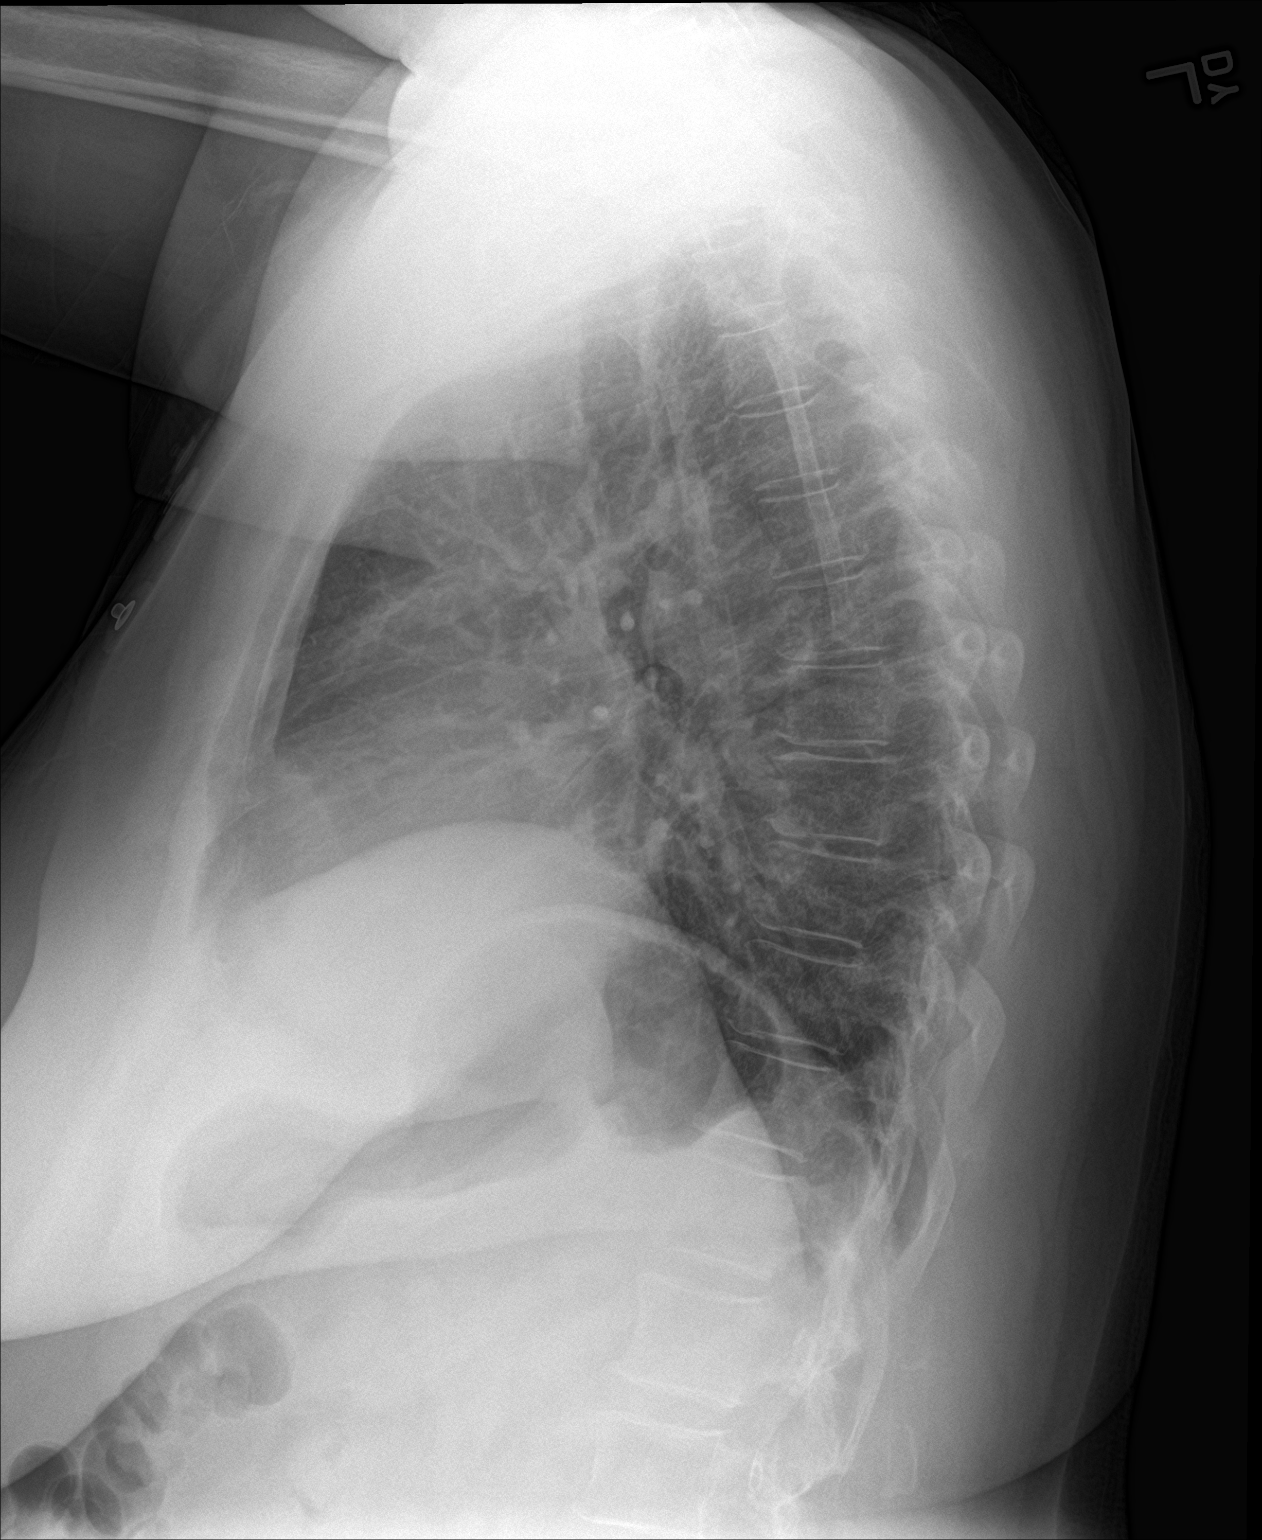

[2 of 2 positions shown; findings below may reference images not displayed]

FINDINGS: Right hemidiaphragm elevation is mild. Lateral view degraded by
patient arm position.

Midline trachea.  Normal heart size and mediastinal contours.

Sharp costophrenic angles.  No pneumothorax.  Clear lungs.
IMPRESSION: No active cardiopulmonary disease.

## 2020-11-14 ENCOUNTER — Ambulatory Visit: Payer: 59 | Admitting: Dermatology

## 2020-12-24 ENCOUNTER — Encounter: Payer: Self-pay | Admitting: Gastroenterology

## 2022-09-09 ENCOUNTER — Encounter: Payer: Self-pay | Admitting: Internal Medicine

## 2022-09-10 ENCOUNTER — Ambulatory Visit: Payer: Medicare Other | Admitting: Certified Registered"

## 2022-09-10 ENCOUNTER — Encounter
Admission: RE | Disposition: A | Discharge status: Home / Self Care | Payer: Self-pay | Source: Home / Self Care | Attending: Internal Medicine

## 2022-09-10 ENCOUNTER — Encounter: Payer: Self-pay | Admitting: Internal Medicine

## 2022-09-10 ENCOUNTER — Other Ambulatory Visit: Payer: Self-pay

## 2022-09-10 ENCOUNTER — Ambulatory Visit
Admission: RE | Admit: 2022-09-10 | Discharge: 2022-09-10 | Disposition: A | Discharge status: Home / Self Care | Payer: Medicare Other | Attending: Internal Medicine | Admitting: Internal Medicine

## 2022-09-10 DIAGNOSIS — Z1211 Encounter for screening for malignant neoplasm of colon: Secondary | ICD-10-CM | POA: Diagnosis present

## 2022-09-10 DIAGNOSIS — D122 Benign neoplasm of ascending colon: Secondary | ICD-10-CM | POA: Insufficient documentation

## 2022-09-10 DIAGNOSIS — K640 First degree hemorrhoids: Secondary | ICD-10-CM | POA: Diagnosis not present

## 2022-09-10 DIAGNOSIS — E119 Type 2 diabetes mellitus without complications: Secondary | ICD-10-CM | POA: Insufficient documentation

## 2022-09-10 DIAGNOSIS — K5140 Inflammatory polyps of colon without complications: Secondary | ICD-10-CM | POA: Diagnosis not present

## 2022-09-10 DIAGNOSIS — E785 Hyperlipidemia, unspecified: Secondary | ICD-10-CM | POA: Diagnosis not present

## 2022-09-10 DIAGNOSIS — K5730 Diverticulosis of large intestine without perforation or abscess without bleeding: Secondary | ICD-10-CM | POA: Insufficient documentation

## 2022-09-10 DIAGNOSIS — Z794 Long term (current) use of insulin: Secondary | ICD-10-CM | POA: Diagnosis not present

## 2022-09-10 DIAGNOSIS — Z8541 Personal history of malignant neoplasm of cervix uteri: Secondary | ICD-10-CM | POA: Diagnosis not present

## 2022-09-10 DIAGNOSIS — I10 Essential (primary) hypertension: Secondary | ICD-10-CM | POA: Diagnosis not present

## 2022-09-10 DIAGNOSIS — K219 Gastro-esophageal reflux disease without esophagitis: Secondary | ICD-10-CM | POA: Insufficient documentation

## 2022-09-10 DIAGNOSIS — Z7984 Long term (current) use of oral hypoglycemic drugs: Secondary | ICD-10-CM | POA: Insufficient documentation

## 2022-09-10 HISTORY — PX: COLONOSCOPY: SHX5424

## 2022-09-10 LAB — GLUCOSE, CAPILLARY: Glucose-Capillary: 158 mg/dL — ABNORMAL HIGH (ref 70–99)

## 2022-09-10 SURGERY — COLONOSCOPY
Anesthesia: General

## 2022-09-10 MED ORDER — SODIUM CHLORIDE 0.9 % IV SOLN: INTRAVENOUS | Status: DC

## 2022-09-10 MED ORDER — SODIUM CHLORIDE 0.9 % IV SOLN: INTRAVENOUS | Status: DC | PRN

## 2022-09-10 MED ORDER — PROPOFOL 500 MG/50ML IV EMUL
INTRAVENOUS | Status: DC | PRN
  Administered 2022-09-10: 120 ug/kg/min via INTRAVENOUS

## 2022-09-10 MED ORDER — ONDANSETRON HCL 4 MG/2ML IJ SOLN
INTRAMUSCULAR | Status: DC | PRN
  Administered 2022-09-10: 4 mg via INTRAVENOUS

## 2022-09-10 NOTE — Anesthesia Preprocedure Evaluation (Signed)
Anesthesia Evaluation  Patient identified by MRN, date of birth, ID band Patient awake    Reviewed: Allergy & Precautions, H&P , NPO status , Patient's Chart, lab work & pertinent test results, reviewed documented beta blocker date and time   Airway Mallampati: II   Neck ROM: full    Dental  (+) Poor Dentition   Pulmonary neg pulmonary ROS   Pulmonary exam normal        Cardiovascular Exercise Tolerance: Good hypertension, negative cardio ROS Normal cardiovascular exam Rhythm:regular Rate:Normal     Neuro/Psych negative neurological ROS  negative psych ROS   GI/Hepatic Neg liver ROS,GERD  Medicated,,  Endo/Other  negative endocrine ROSdiabetes, Well Controlled    Renal/GU negative Renal ROS  negative genitourinary   Musculoskeletal   Abdominal   Peds  Hematology negative hematology ROS (+)   Anesthesia Other Findings Past Medical History: No date: Cancer (HCC)     Comment:  cervical cancer No date: Diabetes mellitus No date: GERD (gastroesophageal reflux disease) No date: Hyperlipidemia No date: Hypertension Past Surgical History: No date: TONSILLECTOMY No date: VAGINAL HYSTERECTOMY BMI    Body Mass Index: 31.31 kg/m     Reproductive/Obstetrics negative OB ROS                             Anesthesia Physical Anesthesia Plan  ASA: 2  Anesthesia Plan: General   Post-op Pain Management:    Induction:   PONV Risk Score and Plan:   Airway Management Planned:   Additional Equipment:   Intra-op Plan:   Post-operative Plan:   Informed Consent: I have reviewed the patients History and Physical, chart, labs and discussed the procedure including the risks, benefits and alternatives for the proposed anesthesia with the patient or authorized representative who has indicated his/her understanding and acceptance.     Dental Advisory Given  Plan Discussed with:  CRNA  Anesthesia Plan Comments:        Anesthesia Quick Evaluation

## 2022-09-10 NOTE — Transfer of Care (Signed)
Immediate Anesthesia Transfer of Care Note  Patient: Anna Wiggins  Procedure(s) Performed: COLONOSCOPY  Patient Location: PACU  Anesthesia Type:MAC  Level of Consciousness: awake  Airway & Oxygen Therapy: Patient Spontanous Breathing  Post-op Assessment: Report given to RN and Post -op Vital signs reviewed and stable  Post vital signs: Reviewed  Last Vitals:  Vitals Value Taken Time  BP 115/61 09/10/22 0858  Temp 35.7 C 09/10/22 0858  Pulse 72 09/10/22 0859  Resp 10 09/10/22 0859  SpO2 100 % 09/10/22 0859  Vitals shown include unvalidated device data.  Last Pain:  Vitals:   09/10/22 0858  TempSrc: Temporal         Complications: No notable events documented.

## 2022-09-10 NOTE — Op Note (Signed)
[facility] Gastroenterology Patient Name: Anna Wiggins Procedure Date: 09/10/2022 8:35 AM MRN: [id number] Account #: [id number] Date of Birth: 1951-02-01 Admit Type: Outpatient Age: 72 Room: [facility] ENDO ROOM 2 Gender: Female Note Status: Finalized Instrument Name: Prentice Docker [id number] Procedure:             Colonoscopy Indications:           Screening for colorectal malignant neoplasm Providers:             Royce Macadamia K. Norma Fredrickson MD, MD Referring MD:          No Local Md, MD (Referring MD) Medicines:             Propofol per Anesthesia Complications:         No immediate complications. Procedure:             Pre-Anesthesia Assessment:                        - The risks and benefits of the procedure and the                         sedation options and risks were discussed with the                         patient. All questions were answered and informed                         consent was obtained.                        - Patient identification and proposed procedure were                         verified prior to the procedure by the nurse. The                         procedure was verified in the procedure room.                        - ASA Grade Assessment: III - A patient with severe                         systemic disease.                        - After reviewing the risks and benefits, the patient                         was deemed in satisfactory condition to undergo the                         procedure.                        After obtaining informed consent, the colonoscope was                         passed under direct vision. Throughout the procedure,  the patient's blood pressure, pulse, and oxygen                         saturations were monitored continuously. The                         Colonoscope was introduced through the anus and                         advanced to the the cecum, identified by appendiceal                          orifice and ileocecal valve. The colonoscopy was                         performed without difficulty. The patient tolerated                         the procedure well. The quality of the bowel                         preparation was good. The ileocecal valve, appendiceal                         orifice, and rectum were photographed. Findings:      The perianal and digital rectal examinations were normal. Pertinent       negatives include normal sphincter tone and no palpable rectal lesions.      Non-bleeding internal hemorrhoids were found during retroflexion. The       hemorrhoids were Grade I (internal hemorrhoids that do not prolapse).      A few medium-mouthed diverticula were found in the sigmoid colon.      Two sessile polyps were found in the cecum. The polyps were 4 to 5 mm in       size. These polyps were removed with a cold snare. Resection and       retrieval were complete.      A 7 mm polyp was found in the proximal ascending colon. The polyp was       semi-pedunculated. The polyp was removed with a cold snare. Resection       and retrieval were complete.      The exam was otherwise without abnormality. Impression:            - Non-bleeding internal hemorrhoids.                        - Diverticulosis in the sigmoid colon.                        - Two 4 to 5 mm polyps in the cecum, removed with a                         cold snare. Resected and retrieved.                        - One 7 mm polyp in the proximal ascending colon,                         removed with  a cold snare. Resected and retrieved.                        - The examination was otherwise normal. Recommendation:        - Patient has a contact number available for                         emergencies. The signs and symptoms of potential                         delayed complications were discussed with the patient.                         Return to normal activities tomorrow. Written                          discharge instructions were provided to the patient.                        - Resume previous diet.                        - Continue present medications.                        - Repeat colonoscopy is recommended for surveillance.                         The colonoscopy date will be determined after                         pathology results from today's exam become available                         for review.                        - Return to GI office PRN.                        - The findings and recommendations were discussed with                         the patient. Procedure Code(s):     --- Professional ---                        [phone], Colonoscopy, flexible; with removal of                         tumor(s), polyp(s), or other lesion(s) by snare                         technique Diagnosis Code(s):     --- Professional ---                        K57.30, Diverticulosis of large intestine without                         perforation or abscess without bleeding  D12.2, Benign neoplasm of ascending colon                        D12.0, Benign neoplasm of cecum                        K64.0, First degree hemorrhoids                        Z12.11, Encounter for screening for malignant neoplasm                         of colon CPT copyright 2022 [employer]. All rights reserved. The codes documented in this report are preliminary and upon coder review may  be revised to meet current compliance requirements. Stanton Kidney MD, MD 09/10/2022 8:57:37 AM This report has been signed electronically. Number of Addenda: 0 Note Initiated On: 09/10/2022 8:35 AM Scope Withdrawal Time: 0 hours 6 minutes 50 seconds  Total Procedure Duration: 0 hours 11 minutes 2 seconds  Estimated Blood Loss:  Estimated blood loss was minimal.      [facility]

## 2022-09-10 NOTE — H&P (Signed)
Outpatient short stay form Pre-procedure 09/10/2022 8:35 AM Anna Wiggins K. Norma Fredrickson, M.D.  Primary Physician: Kandyce Rud, M.D.  Reason for visit:  Colon cancer screening  History of present illness:  Patient presents for colonoscopy for colon cancer screening. The patient denies complaints of abdominal pain, significant change in bowel habits, or rectal bleeding.      Current Facility-Administered Medications:    0.9 %  sodium chloride infusion, , Intravenous, Continuous, [place], Boykin Nearing, MD, Last Rate: 20 mL/hr at 09/10/22 0811, New Bag at 09/10/22 0109  Facility-Administered Medications Prior to Admission  Medication Dose Route Frequency Provider Last Rate Last Admin   0.9 %  sodium chloride infusion  500 mL Intravenous Continuous Mardella Layman, MD       Medications Prior to Admission  Medication Sig Dispense Refill Last Dose   amLODipine (NORVASC) 5 MG tablet Take 1 tablet (5 mg total) by mouth daily. 90 tablet 3 09/09/2022   carvedilol (COREG) 12.5 MG tablet Take 12.5 mg by mouth 2 (two) times daily with a meal.   09/09/2022   simvastatin (ZOCOR) 40 MG tablet TAKE 1 TABLET BY MOUTH AT  BEDTIME 90 tablet 2 Past Week   spironolactone (ALDACTONE) 50 MG tablet Take 50 mg by mouth daily.      albuterol (PROVENTIL HFA;VENTOLIN HFA) 108 (90 Base) MCG/ACT inhaler Inhale 2 puffs into the lungs every 6 (six) hours as needed for wheezing or shortness of breath. 1 Inhaler 2    atenolol (TENORMIN) 25 MG tablet Take 1 tablet (25 mg total) by mouth daily. (Patient not taking: Reported on 09/10/2022) 90 tablet 3 Not Taking   B-D UF III MINI PEN NEEDLES 31G X 5 MM MISC USE 4 DAILY  11    fish oil-omega-3 fatty acids 1000 MG capsule Take 1 g by mouth daily. 3 tablets daily       hydrochlorothiazide (HYDRODIURIL) 25 MG tablet Take 1 tablet (25 mg total) by mouth daily. (Patient not taking: Reported on 09/10/2022) 90 tablet 2 Not Taking   ibuprofen (ADVIL,MOTRIN) 800 MG tablet Take 1 tablet (800 mg  total) by mouth every 8 (eight) hours as needed. 30 tablet 1    Insulin Glargine 300 UNIT/ML SOPN Inject 50 mg into the skin every morning.       insulin glulisine (APIDRA) 100 UNIT/ML injection Inject into the skin. Inject 22 units before breakfast and 8 units before lunch and 12 units at dinner      Insulin Syringe-Needle U-100 (B-D INS SYR ULTRAFINE 1CC/30G) 30G X 1/2" 1 ML MISC USE AS DIRECTED 100 each 3    Insulin Syringe-Needle U-100 30G X 1/2" 0.3 ML MISC Inject 1 Syringe as directed 2 (two) times daily. 200 each 3    losartan (COZAAR) 100 MG tablet Take 1 tablet (100 mg total) by mouth daily. (Patient not taking: Reported on 09/10/2022) 90 tablet 3 Not Taking   metFORMIN (GLUCOPHAGE) 500 MG tablet Take 1 tablet by mouth 2  times daily with a meal (Patient not taking: Reported on 09/03/2022) 180 tablet 3 Not Taking   ONE TOUCH ULTRA TEST test strip USE 1 STRIP DAILY AS DIRECTED 100 each 2    ONE TOUCH ULTRA TEST test strip Use every day 100 each 3    potassium chloride SA (K-DUR,KLOR-CON) 20 MEQ tablet TAKE 1 TABLET BY MOUTH  DAILY 120 tablet 2      No Known Allergies   Past Medical History:  Diagnosis Date   Cancer (HCC)  cervical cancer   Diabetes mellitus    GERD (gastroesophageal reflux disease)    Hyperlipidemia    Hypertension     Review of systems:  Otherwise negative.    Physical Exam  Gen: Alert, oriented. Appears stated age.  HEENT: [place]/AT. PERRLA. Lungs: CTA, no wheezes. CV: RR nl S1, S2. Abd: soft, benign, no masses. BS+ Ext: No edema. Pulses 2+    Planned procedures: Proceed with colonoscopy. The patient understands the nature of the planned procedure, indications, risks, alternatives and potential complications including but not limited to bleeding, infection, perforation, damage to internal organs and possible oversedation/side effects from anesthesia. The patient agrees and gives consent to proceed.  Please refer to procedure notes for findings,  recommendations and patient disposition/instructions.     Starkisha Tullis K. Norma Fredrickson, M.D. Gastroenterology 09/10/2022  8:35 AM

## 2022-09-10 NOTE — Interval H&P Note (Signed)
History and Physical Interval Note:  09/10/2022 8:36 AM  Anna Wiggins  has presented today for surgery, with the diagnosis of V76.51 (ICD-9-CM) - Z12.11 (ICD-10-CM) - Colon cancer screening.  The various methods of treatment have been discussed with the patient and family. After consideration of risks, benefits and other options for treatment, the patient has consented to  Procedure(s) with comments: COLONOSCOPY (N/A) - IDDM as a surgical intervention.  The patient's history has been reviewed, patient examined, no change in status, stable for surgery.  I have reviewed the patient's chart and labs.  Questions were answered to the patient's satisfaction.     [place], [place]

## 2022-09-10 NOTE — Anesthesia Postprocedure Evaluation (Signed)
Anesthesia Post Note  Patient: Etter Claytor  Procedure(s) Performed: COLONOSCOPY  Patient location during evaluation: PACU Anesthesia Type: General Level of consciousness: awake and alert Pain management: pain level controlled Vital Signs Assessment: post-procedure vital signs reviewed and stable Respiratory status: spontaneous breathing, nonlabored ventilation, respiratory function stable and patient connected to nasal cannula oxygen Cardiovascular status: blood pressure returned to baseline and stable Postop Assessment: no apparent nausea or vomiting Anesthetic complications: no   No notable events documented.   Last Vitals:  Vitals:   09/10/22 0759 09/10/22 0858  BP: (!) 179/76 115/61  Pulse: 69 91  Resp: 18 17  Temp: (!) 35.9 C (!) 35.7 C  SpO2: 98% 99%    Last Pain:  Vitals:   09/10/22 0918  TempSrc:   PainSc: 0-No pain                 Yevette Edwards

## 2022-09-11 ENCOUNTER — Encounter: Payer: Self-pay | Admitting: Internal Medicine

## 2023-01-05 ENCOUNTER — Inpatient Hospital Stay
Admission: EM | Admit: 2023-01-05 | Discharge: 2023-01-08 | DRG: 690 | Disposition: A | Discharge status: Home / Self Care | Payer: Medicare Other | Attending: Internal Medicine | Admitting: Internal Medicine

## 2023-01-05 ENCOUNTER — Emergency Department: Payer: Medicare Other

## 2023-01-05 ENCOUNTER — Other Ambulatory Visit: Payer: Self-pay

## 2023-01-05 DIAGNOSIS — E785 Hyperlipidemia, unspecified: Secondary | ICD-10-CM | POA: Diagnosis present

## 2023-01-05 DIAGNOSIS — E1022 Type 1 diabetes mellitus with diabetic chronic kidney disease: Secondary | ICD-10-CM | POA: Diagnosis present

## 2023-01-05 DIAGNOSIS — I959 Hypotension, unspecified: Secondary | ICD-10-CM | POA: Diagnosis present

## 2023-01-05 DIAGNOSIS — Z794 Long term (current) use of insulin: Secondary | ICD-10-CM | POA: Diagnosis not present

## 2023-01-05 DIAGNOSIS — Z833 Family history of diabetes mellitus: Secondary | ICD-10-CM | POA: Diagnosis not present

## 2023-01-05 DIAGNOSIS — I129 Hypertensive chronic kidney disease with stage 1 through stage 4 chronic kidney disease, or unspecified chronic kidney disease: Secondary | ICD-10-CM | POA: Diagnosis present

## 2023-01-05 DIAGNOSIS — N182 Chronic kidney disease, stage 2 (mild): Secondary | ICD-10-CM | POA: Diagnosis present

## 2023-01-05 DIAGNOSIS — N3001 Acute cystitis with hematuria: Secondary | ICD-10-CM | POA: Diagnosis not present

## 2023-01-05 DIAGNOSIS — Z8249 Family history of ischemic heart disease and other diseases of the circulatory system: Secondary | ICD-10-CM

## 2023-01-05 DIAGNOSIS — I700 Atherosclerosis of aorta: Secondary | ICD-10-CM | POA: Diagnosis present

## 2023-01-05 DIAGNOSIS — I10 Essential (primary) hypertension: Secondary | ICD-10-CM | POA: Diagnosis not present

## 2023-01-05 DIAGNOSIS — K219 Gastro-esophageal reflux disease without esophagitis: Secondary | ICD-10-CM | POA: Diagnosis present

## 2023-01-05 DIAGNOSIS — Z6830 Body mass index (BMI) 30.0-30.9, adult: Secondary | ICD-10-CM

## 2023-01-05 DIAGNOSIS — N179 Acute kidney failure, unspecified: Secondary | ICD-10-CM | POA: Diagnosis present

## 2023-01-05 DIAGNOSIS — Z1152 Encounter for screening for COVID-19: Secondary | ICD-10-CM

## 2023-01-05 DIAGNOSIS — Z7984 Long term (current) use of oral hypoglycemic drugs: Secondary | ICD-10-CM

## 2023-01-05 DIAGNOSIS — N170 Acute kidney failure with tubular necrosis: Secondary | ICD-10-CM | POA: Diagnosis present

## 2023-01-05 DIAGNOSIS — E872 Acidosis, unspecified: Secondary | ICD-10-CM | POA: Diagnosis present

## 2023-01-05 DIAGNOSIS — E109 Type 1 diabetes mellitus without complications: Secondary | ICD-10-CM | POA: Diagnosis present

## 2023-01-05 DIAGNOSIS — N10 Acute tubulo-interstitial nephritis: Secondary | ICD-10-CM | POA: Diagnosis not present

## 2023-01-05 DIAGNOSIS — N12 Tubulo-interstitial nephritis, not specified as acute or chronic: Principal | ICD-10-CM

## 2023-01-05 DIAGNOSIS — E669 Obesity, unspecified: Secondary | ICD-10-CM | POA: Diagnosis not present

## 2023-01-05 DIAGNOSIS — Z809 Family history of malignant neoplasm, unspecified: Secondary | ICD-10-CM

## 2023-01-05 DIAGNOSIS — Z8541 Personal history of malignant neoplasm of cervix uteri: Secondary | ICD-10-CM

## 2023-01-05 DIAGNOSIS — Z79899 Other long term (current) drug therapy: Secondary | ICD-10-CM | POA: Diagnosis not present

## 2023-01-05 DIAGNOSIS — N390 Urinary tract infection, site not specified: Secondary | ICD-10-CM | POA: Diagnosis present

## 2023-01-05 DIAGNOSIS — Z7985 Long-term (current) use of injectable non-insulin antidiabetic drugs: Secondary | ICD-10-CM

## 2023-01-05 LAB — CBG MONITORING, ED
Glucose-Capillary: 117 mg/dL — ABNORMAL HIGH (ref 70–99)
Glucose-Capillary: 180 mg/dL — ABNORMAL HIGH (ref 70–99)

## 2023-01-05 LAB — COMPREHENSIVE METABOLIC PANEL
ALT: 17 U/L (ref 0–44)
AST: 35 U/L (ref 15–41)
Albumin: 3.4 g/dL — ABNORMAL LOW (ref 3.5–5.0)
Alkaline Phosphatase: 53 U/L (ref 38–126)
Anion gap: 13 (ref 5–15)
BUN: 26 mg/dL — ABNORMAL HIGH (ref 8–23)
CO2: 18 mmol/L — ABNORMAL LOW (ref 22–32)
Calcium: 8.6 mg/dL — ABNORMAL LOW (ref 8.9–10.3)
Chloride: 100 mmol/L (ref 98–111)
Creatinine, Ser: 1.92 mg/dL — ABNORMAL HIGH (ref 0.44–1.00)
GFR, Estimated: 28 mL/min — ABNORMAL LOW (ref >60)
Glucose, Bld: 144 mg/dL — ABNORMAL HIGH (ref 70–99)
Potassium: 4.2 mmol/L (ref 3.5–5.1)
Sodium: 131 mmol/L — ABNORMAL LOW (ref 135–145)
Total Bilirubin: 1.6 mg/dL — ABNORMAL HIGH (ref 0.3–1.2)
Total Protein: 7.5 g/dL (ref 6.5–8.1)

## 2023-01-05 LAB — URINALYSIS, W/ REFLEX TO CULTURE (INFECTION SUSPECTED)
Bilirubin Urine: NEGATIVE
Glucose, UA: NEGATIVE mg/dL
Ketones, ur: NEGATIVE mg/dL
Nitrite: NEGATIVE
Protein, ur: NEGATIVE mg/dL
Specific Gravity, Urine: 1.003 — ABNORMAL LOW (ref 1.005–1.030)
pH: 6 (ref 5.0–8.0)

## 2023-01-05 LAB — CBC WITH DIFFERENTIAL/PLATELET
Abs Immature Granulocytes: 0.08 10*3/uL — ABNORMAL HIGH (ref 0.00–0.07)
Basophils Absolute: 0 10*3/uL (ref 0.0–0.1)
Basophils Relative: 0 %
Eosinophils Absolute: 0 10*3/uL (ref 0.0–0.5)
Eosinophils Relative: 0 %
HCT: 34.4 % — ABNORMAL LOW (ref 36.0–46.0)
Hemoglobin: 10.8 g/dL — ABNORMAL LOW (ref 12.0–15.0)
Immature Granulocytes: 1 %
Lymphocytes Relative: 6 %
Lymphs Abs: 0.9 10*3/uL (ref 0.7–4.0)
MCH: 23.1 pg — ABNORMAL LOW (ref 26.0–34.0)
MCHC: 31.4 g/dL (ref 30.0–36.0)
MCV: 73.7 fL — ABNORMAL LOW (ref 80.0–100.0)
Monocytes Absolute: 0.8 10*3/uL (ref 0.1–1.0)
Monocytes Relative: 5 %
Neutro Abs: 13 10*3/uL — ABNORMAL HIGH (ref 1.7–7.7)
Neutrophils Relative %: 88 %
Platelets: 164 10*3/uL (ref 150–400)
RBC: 4.67 MIL/uL (ref 3.87–5.11)
RDW: 14 % (ref 11.5–15.5)
Smear Review: NORMAL
WBC: 14.8 10*3/uL — ABNORMAL HIGH (ref 4.0–10.5)
nRBC: 0 % (ref 0.0–0.2)

## 2023-01-05 LAB — PROTIME-INR
INR: 1.3 — ABNORMAL HIGH (ref 0.8–1.2)
Prothrombin Time: 16.2 seconds — ABNORMAL HIGH (ref 11.4–15.2)

## 2023-01-05 LAB — LACTIC ACID, PLASMA
Lactic Acid, Venous: 3.2 mmol/L (ref 0.5–1.9)
Lactic Acid, Venous: 3.3 mmol/L (ref 0.5–1.9)

## 2023-01-05 LAB — SARS CORONAVIRUS 2 BY RT PCR: SARS Coronavirus 2 by RT PCR: NEGATIVE

## 2023-01-05 MED ORDER — SODIUM CHLORIDE 0.9 % IV BOLUS
1000.0000 mL | Freq: Once | INTRAVENOUS | Status: AC
  Administered 2023-01-05: 1000 mL via INTRAVENOUS

## 2023-01-05 MED ORDER — ONDANSETRON HCL 4 MG/2ML IJ SOLN: 4.0000 mg | Freq: Three times a day (TID) | INTRAMUSCULAR | Status: DC | PRN

## 2023-01-05 MED ORDER — LACTATED RINGERS IV BOLUS (SEPSIS)
1000.0000 mL | Freq: Once | INTRAVENOUS | Status: AC
  Administered 2023-01-05: 1000 mL via INTRAVENOUS

## 2023-01-05 MED ORDER — LACTATED RINGERS IV SOLN: INTRAVENOUS | Status: DC

## 2023-01-05 MED ORDER — VANCOMYCIN HCL 1750 MG/350ML IV SOLN
20.0000 mg/kg | Freq: Once | INTRAVENOUS | Status: AC
  Administered 2023-01-05: 1750 mg via INTRAVENOUS
  Filled 2023-01-05 (×2): qty 350

## 2023-01-05 MED ORDER — ENOXAPARIN SODIUM 30 MG/0.3ML IJ SOSY
30.0000 mg | PREFILLED_SYRINGE | INTRAMUSCULAR | Status: DC
  Administered 2023-01-05: 30 mg via SUBCUTANEOUS
  Filled 2023-01-05: qty 0.3

## 2023-01-05 MED ORDER — SODIUM CHLORIDE 0.9 % IV SOLN
2.0000 g | Freq: Once | INTRAVENOUS | Status: AC
  Administered 2023-01-05: 2 g via INTRAVENOUS
  Filled 2023-01-05: qty 12.5

## 2023-01-05 MED ORDER — INSULIN GLARGINE-YFGN 100 UNIT/ML ~~LOC~~ SOLN
65.0000 [IU] | Freq: Every morning | SUBCUTANEOUS | Status: DC
  Administered 2023-01-06 – 2023-01-07 (×2): 65 [IU] via SUBCUTANEOUS
  Filled 2023-01-05 (×3): qty 0.65

## 2023-01-05 MED ORDER — ACETAMINOPHEN 325 MG PO TABS
650.0000 mg | ORAL_TABLET | Freq: Four times a day (QID) | ORAL | Status: DC | PRN
  Administered 2023-01-06 – 2023-01-07 (×2): 650 mg via ORAL
  Filled 2023-01-05 (×2): qty 2

## 2023-01-05 MED ORDER — INSULIN ASPART 100 UNIT/ML IJ SOLN
0.0000 [IU] | Freq: Every day | INTRAMUSCULAR | Status: DC
  Filled 2023-01-05: qty 1

## 2023-01-05 MED ORDER — INSULIN ASPART 100 UNIT/ML IJ SOLN
0.0000 [IU] | Freq: Three times a day (TID) | INTRAMUSCULAR | Status: DC
  Administered 2023-01-06 – 2023-01-07 (×2): 2 [IU] via SUBCUTANEOUS
  Filled 2023-01-05 (×2): qty 1

## 2023-01-05 MED ORDER — ADULT MULTIVITAMIN W/MINERALS CH
1.0000 | ORAL_TABLET | Freq: Every day | ORAL | Status: DC
  Administered 2023-01-06 – 2023-01-07 (×2): 1 via ORAL
  Filled 2023-01-05 (×2): qty 1

## 2023-01-05 MED ORDER — HEPARIN SODIUM (PORCINE) 5000 UNIT/ML IJ SOLN: 5000.0000 [IU] | Freq: Three times a day (TID) | INTRAMUSCULAR | Status: DC

## 2023-01-05 MED ORDER — PANTOPRAZOLE SODIUM 40 MG PO TBEC
40.0000 mg | DELAYED_RELEASE_TABLET | Freq: Every day | ORAL | Status: DC
  Administered 2023-01-06 – 2023-01-07 (×2): 40 mg via ORAL
  Filled 2023-01-05 (×2): qty 1

## 2023-01-05 MED ORDER — SIMVASTATIN 20 MG PO TABS
40.0000 mg | ORAL_TABLET | Freq: Every day | ORAL | Status: DC
  Administered 2023-01-05 – 2023-01-07 (×3): 40 mg via ORAL
  Filled 2023-01-05 (×2): qty 2
  Filled 2023-01-05: qty 4

## 2023-01-05 MED ORDER — SODIUM CHLORIDE 0.9 % IV SOLN
1.0000 g | INTRAVENOUS | Status: DC
  Administered 2023-01-06 – 2023-01-07 (×2): 1 g via INTRAVENOUS
  Filled 2023-01-05 (×3): qty 10

## 2023-01-05 NOTE — Progress Notes (Signed)
CODE SEPSIS - PHARMACY COMMUNICATION  **Broad Spectrum Antibiotics should be administered within 1 hour of Sepsis diagnosis**  Time Code Sepsis Called/Page Received: 9/23 1634  Antibiotics Ordered: Cefepime, vancomycin  Time of 1st antibiotic administration: 1642  Additional action taken by pharmacy: N/A  If necessary, Name of Provider/Nurse Contacted: N/A    Merryl Hacker ,PharmD Clinical Pharmacist  01/05/2023  4:39 PM

## 2023-01-05 NOTE — Sepsis Progress Note (Signed)
Elink monitoring for the code sepsis protocol.  

## 2023-01-05 NOTE — Sepsis Progress Note (Signed)
Notified bedside nurse of need to draw repeat lactic acid. 

## 2023-01-05 NOTE — ED Provider Notes (Signed)
[facility] Provider Note    Event Date/Time   First MD Initiated Contact with Patient 01/05/23 1603     (approximate)   History   Chills   HPI  Anna Wiggins is a 72 y.o. female past medical tree significant for diabetes, hypertension, who presents to the emergency department with chills.  States that she has been having chills all day long and not feeling well.  Denies any headache, change in vision, cough, chest pain, shortness of breath, abdominal pain, vomiting, diarrhea, dysuria, urinary urgency or frequency.  Denies any blood in her stool.  No recent antibiotic use.  No recent hospitalizations.     Physical Exam   Triage Vital Signs: ED Triage Vitals  Encounter Vitals Group     BP 01/05/23 1523 (!) 73/50     Systolic BP Percentile --      Diastolic BP Percentile --      Pulse Rate 01/05/23 1518 81     Resp 01/05/23 1518 16     Temp 01/05/23 1518 98.4 F (36.9 C)     Temp Source 01/05/23 1518 Oral     SpO2 01/05/23 1518 97 %     Weight 01/05/23 1519 193 lb (87.5 kg)     Height 01/05/23 1519 5\' 7"  (1.702 m)     Head Circumference --      Peak Flow --      Pain Score 01/05/23 1519 0     Pain Loc --      Pain Education --      Exclude from Growth Chart --     Most recent vital signs: Vitals:   01/05/23 1700 01/05/23 1730  BP: (!) 94/51 (!) 102/48  Pulse: 73 74  Resp: 13 20  Temp:    SpO2: 100% 100%    Physical Exam Constitutional:      Appearance: She is well-developed.  HENT:     Head: Atraumatic.  Eyes:     Conjunctiva/sclera: Conjunctivae normal.  Cardiovascular:     Rate and Rhythm: Regular rhythm.  Pulmonary:     Effort: No respiratory distress.  Abdominal:     General: There is no distension.  Musculoskeletal:        General: Normal range of motion.     Cervical back: Normal range of motion.  Skin:    General: Skin is warm.     Capillary Refill: Capillary refill takes less than 2 seconds.  Neurological:      Mental Status: She is alert. Mental status is at baseline.     IMPRESSION / MDM / ASSESSMENT AND PLAN / ED COURSE  I reviewed the triage vital signs and the nursing notes.  On arrival to the emergency department patient significantly hypotensive with a blood pressure of 70/53.  Afebrile and 97% on room air.  Clinical picture concerning for possible sepsis.  Differential diagnosis including infectious process, dehydration, electrolyte abnormality, hypothyroidism  Blood cultures obtained.  Started on 30 cc/kg of IV fluids.  Started on broad-spectrum antibiotic to treat unknown source  No tachycardic or bradycardic dysrhythmias while on cardiac telemetry.  RADIOLOGY I independently reviewed imaging, my interpretation of imaging: CT chest abdomen pelvis without acute intra-abdominal pathology, no obvious pneumonia  Read as moderate left perinephric fat stranding without hydronephrosis concerning for possible pyelonephritis.  LABS (all labs ordered are listed, but only abnormal results are displayed) Labs interpreted as -    Labs Reviewed  COMPREHENSIVE METABOLIC PANEL - Abnormal; Notable  for the following components:      Result Value   Sodium 131 (*)    CO2 18 (*)    Glucose, Bld 144 (*)    BUN 26 (*)    Creatinine, Ser 1.92 (*)    Calcium 8.6 (*)    Albumin 3.4 (*)    Total Bilirubin 1.6 (*)    GFR, Estimated 28 (*)    All other components within normal limits  LACTIC ACID, PLASMA - Abnormal; Notable for the following components:   Lactic Acid, Venous 3.2 (*)    All other components within normal limits  LACTIC ACID, PLASMA - Abnormal; Notable for the following components:   Lactic Acid, Venous 3.3 (*)    All other components within normal limits  CBC WITH DIFFERENTIAL/PLATELET - Abnormal; Notable for the following components:   WBC 14.8 (*)    Hemoglobin 10.8 (*)    HCT 34.4 (*)    MCV 73.7 (*)    MCH 23.1 (*)    Neutro Abs 13.0 (*)    Abs Immature Granulocytes  0.08 (*)    All other components within normal limits  URINALYSIS, W/ REFLEX TO CULTURE (INFECTION SUSPECTED) - Abnormal; Notable for the following components:   Color, Urine YELLOW (*)    APPearance HAZY (*)    Specific Gravity, Urine 1.003 (*)    Hgb urine dipstick SMALL (*)    Leukocytes,Ua LARGE (*)    Bacteria, UA RARE (*)    All other components within normal limits  PROTIME-INR - Abnormal; Notable for the following components:   Prothrombin Time 16.2 (*)    INR 1.3 (*)    All other components within normal limits  CBG MONITORING, ED - Abnormal; Notable for the following components:   Glucose-Capillary 117 (*)    All other components within normal limits  SARS CORONAVIRUS 2 BY RT PCR  CULTURE, BLOOD (ROUTINE X 2)  CULTURE, BLOOD (ROUTINE X 2)  URINE CULTURE  PROCALCITONIN  BASIC METABOLIC PANEL  CBC  LACTIC ACID, PLASMA  LACTIC ACID, PLASMA  LACTIC ACID, PLASMA     MDM  Leukocytosis and elevated lactic acid at 3.2, repeat lactic acid elevated at 3.3.  Patient received 30 cc/kg of IV fluids.  Urine resulted concerning for urinary tract infection.  CT scan concern for possible pyelonephritis.  Consulted hospitalist for admission for sepsis secondary to a urinary tract infection/pyelonephritis.  Third lactic acid added on given elevated second lactic acid increased from the first.     PROCEDURES:  Critical Care performed: yes  .Critical Care  Performed by: Corena Herter, MD Authorized by: Corena Herter, MD   Critical care provider statement:    Critical care time (minutes):  50   Critical care time was exclusive of:  Separately billable procedures and treating other patients   Critical care was necessary to treat or prevent imminent or life-threatening deterioration of the following conditions:  Sepsis   Critical care was time spent personally by me on the following activities:  Development of treatment plan with patient or surrogate, discussions with  consultants, evaluation of patient's response to treatment, examination of patient, ordering and review of laboratory studies, ordering and review of radiographic studies, ordering and performing treatments and interventions, pulse oximetry, re-evaluation of patient's condition and review of old charts   Patient's presentation is most consistent with acute presentation with potential threat to life or bodily function.   MEDICATIONS ORDERED IN ED: Medications  lactated ringers bolus 1,000 mL (1,000  mLs Intravenous New Bag/Given 01/05/23 1643)    And  lactated ringers bolus 1,000 mL (1,000 mLs Intravenous New Bag/Given 01/05/23 1728)    And  lactated ringers bolus 1,000 mL (has no administration in time range)  vancomycin (VANCOREADY) IVPB 1750 mg/350 mL (has no administration in time range)  lactated ringers infusion (has no administration in time range)  ondansetron (ZOFRAN) injection 4 mg (has no administration in time range)  acetaminophen (TYLENOL) tablet 650 mg (has no administration in time range)  insulin aspart (novoLOG) injection 0-5 Units (has no administration in time range)  insulin aspart (novoLOG) injection 0-9 Units (has no administration in time range)  simvastatin (ZOCOR) tablet 40 mg (has no administration in time range)  Insulin Glargine SOPN 65 Units (has no administration in time range)  pantoprazole (PROTONIX) EC tablet 40 mg (has no administration in time range)  multivitamin with minerals tablet 1 tablet (has no administration in time range)  cefTRIAXone (ROCEPHIN) 1 g in sodium chloride 0.9 % 100 mL IVPB (has no administration in time range)  enoxaparin (LOVENOX) injection 30 mg (has no administration in time range)  sodium chloride 0.9 % bolus 1,000 mL (0 mLs Intravenous Stopped 01/05/23 1721)  ceFEPIme (MAXIPIME) 2 g in sodium chloride 0.9 % 100 mL IVPB (0 g Intravenous Stopped 01/05/23 1712)    FINAL CLINICAL IMPRESSION(S) / ED DIAGNOSES   Final diagnoses:   Pyelonephritis     Rx / DC Orders   ED Discharge Orders     None        Note:  This document was prepared using Dragon voice recognition software and may include unintentional dictation errors.   Corena Herter, MD 01/05/23 2028

## 2023-01-05 NOTE — H&P (Signed)
History and Physical    Anna Wiggins SWN:[id number] DOB: [birth year] DOA: 01/05/2023  Referring MD/NP/PA:   PCP: Nira Retort   Patient coming from:  The patient is coming from home.     Chief Complaint: chills and urinary frequency  HPI: Anna Wiggins is a 72 y.o. female with medical history significant of HTN, HLD, DM-type I, CKD-2, cervical cancer, GERD, who presents with chills and and urinary frequency.  Patient states that she started having chills and feeling cold since last night, no fever.  She feels like she is going to pass out but did not.  She has nausea, no vomiting, diarrhea or abdominal pain.  She has increased urinary frequency, but no dysuria or burning on urination.  No flank pain.  Denies cough, chest pain, shortness of breath.  She had headache earlier which has resolved.  Patient was initially hypotensive with blood pressure 73/50, which improved to 102/48 after giving 3 L IV fluid bolus in ED.   Data reviewed independently and ED Course: pt was found to have WBC 14.8, positive urinalysis (yellow appearance, large amount of leukocyte, rare bacteria, WBC 21-50) lactic acid 3.2 --> 3.3, negative COVID PCR, worsening renal function with creatinine 1.92, BUN 26, GFR 28 (recent baseline creatinine 1.0 on 07/15/22)..  Temperature normal, heart rate 81, RR 20, oxygen saturation 100% on room air.  Chest x-ray is negative for infiltration.  Patient is admitted to PCU as inpatient  CT-chest/abd/pelvis: 1. Moderate left perinephric fat stranding without hydronephrosis or hydroureter. Findings Anna be secondary to pyelonephritis or recently passed stone. 2. Negative for acute airspace disease. 3. Aortic atherosclerosis.   Aortic Atherosclerosis (ICD10-I70.0).   EKG:   Not done in ED, will get one.     Review of Systems:   General: no fevers, has chills, no body weight gain, has fatigue HEENT: no blurry vision, hearing changes or sore throat Respiratory:  no dyspnea, coughing, wheezing CV: no chest pain, no palpitations GI: has nausea, no vomiting, abdominal pain, diarrhea, constipation GU: no dysuria, burning on urination, has increased urinary frequency, no hematuria  Ext: no leg edema Neuro: no unilateral weakness, numbness, or tingling, no vision change or hearing loss Skin: no rash, no skin tear. MSK: No muscle spasm, no deformity, no limitation of range of movement in spin Heme: No easy bruising.  Travel history: No recent long distant travel.   Allergy: No Known Allergies  Past Medical History:  Diagnosis Date   Cancer (HCC)    cervical cancer   Diabetes mellitus    GERD (gastroesophageal reflux disease)    Hyperlipidemia    Hypertension     Past Surgical History:  Procedure Laterality Date   COLONOSCOPY N/A 09/10/2022   Procedure: COLONOSCOPY;  Surgeon: Toledo, Boykin Nearing, MD;  Location: [facility];  Service: Gastroenterology;  Laterality: N/A;  IDDM   TONSILLECTOMY     VAGINAL HYSTERECTOMY      Social History:  reports that she has never smoked. She has never used smokeless tobacco. She reports that she does not drink alcohol and does not use drugs.  Family History:  Family History  Problem Relation Age of Onset   Diabetes Mother    Cancer Mother    Diabetes Sister    Diabetes Brother    Cancer Maternal Aunt    Heart disease Maternal Grandfather      Prior to Admission medications   Medication Sig Start Date End Date Taking? Authorizing Provider  albuterol (PROVENTIL HFA;VENTOLIN HFA) 108 (  90 Base) MCG/ACT inhaler Inhale 2 puffs into the lungs every 6 (six) hours as needed for wheezing or shortness of breath. 09/27/17   Arnaldo Natal, MD  amLODipine (NORVASC) 5 MG tablet Take 1 tablet (5 mg total) by mouth daily. 12/30/16   Dianne Dun, MD  atenolol (TENORMIN) 25 MG tablet Take 1 tablet (25 mg total) by mouth daily. Patient not taking: Reported on 09/10/2022 12/30/16   Dianne Dun, MD  B-D UF III MINI  PEN NEEDLES 31G X 5 MM MISC USE 4 DAILY 09/01/16   [provider]  carvedilol (COREG) 12.5 MG tablet Take 12.5 mg by mouth 2 (two) times daily with a meal.    [provider]  fish oil-omega-3 fatty acids 1000 MG capsule Take 1 g by mouth daily. 3 tablets daily     [provider]  hydrochlorothiazide (HYDRODIURIL) 25 MG tablet Take 1 tablet (25 mg total) by mouth daily. Patient not taking: Reported on 09/10/2022 01/05/17   Dianne Dun, MD  ibuprofen (ADVIL,MOTRIN) 800 MG tablet Take 1 tablet (800 mg total) by mouth every 8 (eight) hours as needed. 09/19/14   Lorre Munroe, NP  Insulin Glargine 300 UNIT/ML SOPN Inject 50 mg into the skin every morning.     [provider]  insulin glulisine (APIDRA) 100 UNIT/ML injection Inject into the skin. Inject 22 units before breakfast and 8 units before lunch and 12 units at dinner    [provider]  Insulin Syringe-Needle U-100 (B-D INS SYR ULTRAFINE 1CC/30G) 30G X 1/2" 1 ML MISC USE AS DIRECTED 10/20/13   Roderick Pee, MD  Insulin Syringe-Needle U-100 30G X 1/2" 0.3 ML MISC Inject 1 Syringe as directed 2 (two) times daily. 10/24/11   Roderick Pee, MD  losartan (COZAAR) 100 MG tablet Take 1 tablet (100 mg total) by mouth daily. Patient not taking: Reported on 09/10/2022 12/30/16   Dianne Dun, MD  metFORMIN (GLUCOPHAGE) 500 MG tablet Take 1 tablet by mouth 2  times daily with a meal Patient not taking: Reported on 09/03/2022 10/20/13   Roderick Pee, MD  ONE [facility] ULTRA TEST test strip USE 1 STRIP DAILY AS DIRECTED 05/01/14   Roderick Pee, MD  ONE [facility] ULTRA TEST test strip Use every day 06/06/14   Roderick Pee, MD  potassium chloride SA (K-DUR,KLOR-CON) 20 MEQ tablet TAKE 1 TABLET BY MOUTH  DAILY 04/08/16   Dianne Dun, MD  simvastatin (ZOCOR) 40 MG tablet TAKE 1 TABLET BY MOUTH AT  BEDTIME 04/24/17   Dianne Dun, MD  spironolactone (ALDACTONE) 50 MG tablet Take 50 mg by mouth daily.    [provider]    Physical Exam: Vitals:   01/05/23 1645 01/05/23 1700 01/05/23 1730 01/05/23 2048  BP: (!) 92/48 (!) 94/51 (!) 102/48   Pulse:  73 74   Resp:  13 20   Temp:    98.3 F (36.8 C)  TempSrc:    Oral  SpO2:  100% 100%   Weight:      Height:       General: Not in acute distress HEENT:       Eyes: PERRL, EOMI, no jaundice       ENT: No discharge from the ears and nose, no pharynx injection, no tonsillar enlargement.        Neck: No JVD, no bruit, no mass felt. Heme: No neck lymph node enlargement. Cardiac: S1/S2, RRR, No  murmurs, No gallops or rubs. Respiratory: No rales, wheezing, rhonchi or rubs. GI: Soft, nondistended, nontender, no rebound pain, no organomegaly, BS present. GU: No hematuria Ext: No pitting leg edema bilaterally. 1+DP/PT pulse bilaterally. Musculoskeletal: No joint deformities, No joint redness or warmth, no limitation of ROM in spin. Skin: No rashes.  Neuro: Alert, oriented X3, cranial nerves II-XII grossly intact, moves all extremities normally Psych: Patient is not psychotic, no suicidal or hemocidal ideation.  Labs on Admission: I have personally reviewed following labs and imaging studies  CBC: Recent Labs  Lab 01/05/23 1535  WBC 14.8*  NEUTROABS 13.0*  HGB 10.8*  HCT 34.4*  MCV 73.7*  PLT 164   Basic Metabolic Panel: Recent Labs  Lab 01/05/23 1535  NA 131*  K 4.2  CL 100  CO2 18*  GLUCOSE 144*  BUN 26*  CREATININE 1.92*  CALCIUM 8.6*   GFR: Estimated Creatinine Clearance: 30.5 mL/min (A) (by C-G formula based on SCr of 1.92 mg/dL (H)). Liver Function Tests: Recent Labs  Lab 01/05/23 1535  AST 35  ALT 17  ALKPHOS 53  BILITOT 1.6*  PROT 7.5  ALBUMIN 3.4*   No results for input(s): "LIPASE", "AMYLASE" in the last 168 hours. No results for input(s): "AMMONIA" in the last 168 hours. Coagulation Profile: Recent Labs  Lab 01/05/23 1622  INR 1.3*   Cardiac Enzymes: No results for input(s): "CKTOTAL", "CKMB",  "CKMBINDEX", "TROPONINI" in the last 168 hours. BNP (last 3 results) No results for input(s): "PROBNP" in the last 8760 hours. HbA1C: No results for input(s): "HGBA1C" in the last 72 hours. CBG: Recent Labs  Lab 01/05/23 1613  GLUCAP 117*   Lipid Profile: No results for input(s): "CHOL", "HDL", "LDLCALC", "TRIG", "CHOLHDL", "LDLDIRECT" in the last 72 hours. Thyroid Function Tests: No results for input(s): "TSH", "T4TOTAL", "FREET4", "T3FREE", "THYROIDAB" in the last 72 hours. Anemia Panel: No results for input(s): "VITAMINB12", "FOLATE", "FERRITIN", "TIBC", "IRON", "RETICCTPCT" in the last 72 hours. Urine analysis:    Component Value Date/Time   COLORURINE YELLOW (A) 01/05/2023 1904   APPEARANCEUR HAZY (A) 01/05/2023 1904   LABSPEC 1.003 (L) 01/05/2023 1904   PHURINE 6.0 01/05/2023 1904   GLUCOSEU NEGATIVE 01/05/2023 1904   HGBUR SMALL (A) 01/05/2023 1904   HGBUR negative 09/28/2009 0823   BILIRUBINUR NEGATIVE 01/05/2023 1904   BILIRUBINUR n 10/13/2013 0958   KETONESUR NEGATIVE 01/05/2023 1904   PROTEINUR NEGATIVE 01/05/2023 1904   UROBILINOGEN 0.2 10/13/2013 0958   UROBILINOGEN 0.2 09/28/2009 0823   NITRITE NEGATIVE 01/05/2023 1904   LEUKOCYTESUR LARGE (A) 01/05/2023 1904   Sepsis Labs: @LABRCNTIP (procalcitonin:4,lacticidven:4) ) Recent Results (from the past 240 hour(s))  SARS Coronavirus 2 by RT PCR (hospital order, performed in [facility] hospital lab) *cepheid single result test* Anterior Nasal Swab     Status: None   Collection Time: 01/05/23  3:22 PM   Specimen: Anterior Nasal Swab  Result Value Ref Range Status   SARS Coronavirus 2 by RT PCR NEGATIVE NEGATIVE Final    Comment: (NOTE) SARS-CoV-2 target nucleic acids are NOT DETECTED.  The SARS-CoV-2 RNA is generally detectable in upper and lower respiratory specimens during the acute phase of infection. The lowest concentration of SARS-CoV-2 viral copies this assay can detect is 250 copies / mL. A negative  result does not preclude SARS-CoV-2 infection and should not be used as the sole basis for treatment or other patient management decisions.  A negative result Anna occur with improper specimen collection / handling, submission of specimen other  than nasopharyngeal swab, presence of viral mutation(s) within the areas targeted by this assay, and inadequate number of viral copies (<250 copies / mL). A negative result must be combined with clinical observations, patient history, and epidemiological information.  Fact Sheet for Patients:   RoadLapTop.co.za  Fact Sheet for Healthcare Providers: http://kim-miller.com/  This test is not yet approved or  cleared by the [place] FDA and has been authorized for detection and/or diagnosis of SARS-CoV-2 by FDA under an Emergency Use Authorization (EUA).  This EUA will remain in effect (meaning this test can be used) for the duration of the COVID-19 declaration under Section 564(b)(1) of the Act, 21 U.S.C. section 360bbb-3(b)(1), unless the authorization is terminated or revoked sooner.  Performed at [facility], [address] [place], [place] [address]      Radiological Exams on Admission: CT CHEST ABDOMEN PELVIS WO CONTRAST  Result Date: 01/05/2023 CLINICAL DATA:  Sepsis EXAM: CT CHEST, ABDOMEN AND PELVIS WITHOUT CONTRAST TECHNIQUE: Multidetector CT imaging of the chest, abdomen and pelvis was performed following the standard protocol without IV contrast. RADIATION DOSE REDUCTION: This exam was performed according to the departmental dose-optimization program which includes automated exposure control, adjustment of the mA and/or kV according to patient size and/or use of iterative reconstruction technique. COMPARISON:  CT 05/07/2011 FINDINGS: CT CHEST FINDINGS Cardiovascular: Limited assessment without intravenous contrast. Nonaneurysmal aorta. Mild atherosclerosis. Normal cardiac size.  No pericardial effusion Mediastinum/Nodes: Midline trachea. No thyroid mass. No suspicious lymph nodes. Esophagus within normal limits Lungs/Pleura: Lungs are clear. No pleural effusion or pneumothorax. Musculoskeletal: No chest wall mass or suspicious bone lesions identified. CT ABDOMEN PELVIS FINDINGS Hepatobiliary: No focal liver abnormality is seen. No gallstones, gallbladder wall thickening, or biliary dilatation. Pancreas: Unremarkable. No pancreatic ductal dilatation or surrounding inflammatory changes. Spleen: Normal in size without focal abnormality. Adrenals/Urinary Tract: Adrenal glands are normal. Moderate left perinephric fat stranding. No hydronephrosis or hydroureter. Bladder is unremarkable Stomach/Bowel: Stomach is within normal limits. Appendix appears normal. No evidence of bowel wall thickening, distention, or inflammatory changes. Vascular/Lymphatic: Moderate aortic atherosclerosis. No aneurysm. No suspicious lymph nodes Reproductive: Status post hysterectomy. No adnexal masses. Other: Negative for pelvic effusion or free air Musculoskeletal: No acute or suspicious osseous abnormality IMPRESSION: 1. Moderate left perinephric fat stranding without hydronephrosis or hydroureter. Findings Anna be secondary to pyelonephritis or recently passed stone. 2. Negative for acute airspace disease. 3. Aortic atherosclerosis. Aortic Atherosclerosis (ICD10-I70.0). Electronically Signed   By: Jasmine Pang M.D.   On: 01/05/2023 19:48   DG Chest Port 1 View  Result Date: 01/05/2023 CLINICAL DATA:  Sepsis EXAM: PORTABLE CHEST 1 VIEW COMPARISON:  None Available. FINDINGS: Lung volumes are small. Mild elevation of the right hemidiaphragm, unchanged. Lungs are clear save for minimal right basilar atelectasis. No pneumothorax or pleural effusion. Cardiac size is within normal limits. Pulmonary vascularity is normal. No acute bone abnormality. IMPRESSION: 1. Pulmonary hypoinflation. Electronically Signed   By:  Helyn Numbers M.D.   On: 01/05/2023 17:51      Assessment/Plan Principal Problem:   Acute pyelonephritis Active Problems:   Essential hypertension   HLD (hyperlipidemia)   Type 1 diabetes mellitus (HCC)   Acute renal failure superimposed on stage 2 chronic kidney disease (HCC)   Obesity (BMI 30-39.9)   Assessment and Plan:  Acute pyelonephritis: Patient has positive urinalysis.  CT scan showed possible pyelonephritis.  Patient does not meets criteria for sepsis (WBC 14.8, heart rate 81, RR 20, temperature normal), but obviously patient is  at high risk of developing severe sepsis since patient has elevated lactic acid 3.2 --> 3.3 and hypotension.  Her blood pressure was 73/50, which responded to IV fluid resuscitation.  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  Patient is taking Humalog, Ozempic and glargine insulin 90 units daily -Glargine insulin 65 unit daily -Sliding scale insulin  Acute renal failure superimposed on stage 2 chronic kidney disease (HCC): Likely multifactorial etiology including pyelonephritis, continuation of Micardis and spironolactone.  ATN is also possible due to hypotension -Avoid using renal toxic medications -Hold Micardis and spironolactone -IV fluid as above  Obesity (BMI 30-39.9): Body weight 83.5 kg, BMI 30.23.  Patient is taking Ozempic weekly at home -Encouraged losing weight -Exercise and healthy diet    DVT ppx: SQ Lovenox  Code Status: Full code   Family  Communication: Yes, patient's husband    at bed side.      Disposition Plan:  Anticipate discharge back to previous environment  Consults called: None  Admission status and Level of care: Progressive:     as inpt      Dispo: The patient is from: Home              Anticipated d/c is to: Home              Anticipated d/c date is: 2 days              Patient currently is not medically stable to d/c.    Severity of Illness:  The appropriate patient status for this patient is INPATIENT. Inpatient status is judged to be reasonable and necessary in order to provide the required intensity of service to ensure the patient's safety. The patient's presenting symptoms, physical exam findings, and initial radiographic and laboratory data in the context of their chronic comorbidities is felt to place them at high risk for further clinical deterioration. Furthermore, it is not anticipated that the patient will be medically stable for discharge from the hospital within 2 midnights of admission.   * I certify that at the point of admission it is my clinical judgment that the patient will require inpatient hospital care spanning beyond 2 midnights from the point of admission due to high intensity of service, high risk for further deterioration and high frequency of surveillance required.*       Date of Service 01/05/2023    Lorretta Harp [facility]   If 7PM-7AM, please contact night-coverage [email or web address] 01/05/2023, 9:03 PM

## 2023-01-05 NOTE — ED Triage Notes (Signed)
Pt coming in with husband. Feeling chills/weakness, like she's "going to pass out". Started last night. States low BP and cbg, but then said it was BP 116/71 CBG 149 at home

## 2023-01-05 NOTE — ED Notes (Signed)
Lab reported need recollect for lactic acid

## 2023-01-05 NOTE — ED Notes (Signed)
Patient transported to CT 

## 2023-01-05 NOTE — Progress Notes (Signed)
PHARMACY -  BRIEF ANTIBIOTIC NOTE   Pharmacy has received consult(s) for cefepime and vancomycin from an ED provider.  The patient's profile has been reviewed for ht/wt/allergies/indication/available labs.    One time order(s) placed for Cefepime 2 g IV x 1, vancomycin 1.75 g IV x 1  Further antibiotics/pharmacy consults should be ordered by admitting physician if indicated.                       Thank you, Merryl Hacker, PharmD Clinical Pharmacist 01/05/2023  4:40 PM

## 2023-01-06 ENCOUNTER — Encounter: Payer: Self-pay | Admitting: Internal Medicine

## 2023-01-06 LAB — BASIC METABOLIC PANEL
Anion gap: 7 (ref 5–15)
BUN: 20 mg/dL (ref 8–23)
CO2: 24 mmol/L (ref 22–32)
Calcium: 8.2 mg/dL — ABNORMAL LOW (ref 8.9–10.3)
Chloride: 106 mmol/L (ref 98–111)
Creatinine, Ser: 1.13 mg/dL — ABNORMAL HIGH (ref 0.44–1.00)
GFR, Estimated: 52 mL/min — ABNORMAL LOW (ref >60)
Glucose, Bld: 135 mg/dL — ABNORMAL HIGH (ref 70–99)
Potassium: 3.9 mmol/L (ref 3.5–5.1)
Sodium: 137 mmol/L (ref 135–145)

## 2023-01-06 LAB — CBG MONITORING, ED
Glucose-Capillary: 80 mg/dL (ref 70–99)
Glucose-Capillary: 101 mg/dL — ABNORMAL HIGH (ref 70–99)
Glucose-Capillary: 183 mg/dL — ABNORMAL HIGH (ref 70–99)

## 2023-01-06 LAB — PROCALCITONIN: Procalcitonin: 28.76 ng/mL

## 2023-01-06 LAB — CBC
HCT: 29.2 % — ABNORMAL LOW (ref 36.0–46.0)
Hemoglobin: 9.1 g/dL — ABNORMAL LOW (ref 12.0–15.0)
MCH: 23.3 pg — ABNORMAL LOW (ref 26.0–34.0)
MCHC: 31.2 g/dL (ref 30.0–36.0)
MCV: 74.7 fL — ABNORMAL LOW (ref 80.0–100.0)
Platelets: 126 10*3/uL — ABNORMAL LOW (ref 150–400)
RBC: 3.91 MIL/uL (ref 3.87–5.11)
RDW: 14.1 % (ref 11.5–15.5)
WBC: 18.3 10*3/uL — ABNORMAL HIGH (ref 4.0–10.5)
nRBC: 0 % (ref 0.0–0.2)

## 2023-01-06 LAB — GLUCOSE, CAPILLARY
Glucose-Capillary: 109 mg/dL — ABNORMAL HIGH (ref 70–99)
Glucose-Capillary: 127 mg/dL — ABNORMAL HIGH (ref 70–99)

## 2023-01-06 LAB — LACTIC ACID, PLASMA: Lactic Acid, Venous: 1.9 mmol/L (ref 0.5–1.9)

## 2023-01-06 MED ORDER — ENOXAPARIN SODIUM 60 MG/0.6ML IJ SOSY
0.5000 mg/kg | PREFILLED_SYRINGE | INTRAMUSCULAR | Status: DC
  Administered 2023-01-06 – 2023-01-07 (×2): 45 mg via SUBCUTANEOUS
  Filled 2023-01-06 (×2): qty 0.6

## 2023-01-06 NOTE — ED Notes (Signed)
Pt assisted to restroom across hall.  Pt ambulated well w/ standby assist.  Pt did become SOB while walking but O2 saturation was 98% when reconnected to monitor.

## 2023-01-06 NOTE — Progress Notes (Signed)
[facility]  - [place] at [facility]   PATIENT NAME: Anna Wiggins    MR#:  [id number]  DATE OF BIRTH:  February 23, 1951  SUBJECTIVE:   Patient seen in the ER. Overall feeling better. Denies any back pain. Tolerating PO diet. No family at bedside   VITALS:  Blood pressure (!) 116/95, pulse 65, temperature 98.2 F (36.8 C), temperature source Oral, resp. rate (!) 26, height 5\' 7"  (1.702 m), weight 87.5 kg, SpO2 95%.  PHYSICAL EXAMINATION:   GENERAL:  72 y.o.-year-old patient with no acute distress. Morbid obesity LUNGS: Normal breath sounds bilaterally, no wheezing CARDIOVASCULAR: S1, S2 normal. No murmur   ABDOMEN: Soft, nontender, nondistended. Bowel sounds present.  EXTREMITIES: No  edema b/l.    NEUROLOGIC: nonfocal  patient is alert and awake SKIN: No obvious rash, lesion, or ulcer.   LABORATORY PANEL:  CBC Recent Labs  Lab 01/06/23 0415  WBC 18.3*  HGB 9.1*  HCT 29.2*  PLT 126*    Chemistries  Recent Labs  Lab 01/05/23 1535 01/06/23 0415  NA 131* 137  K 4.2 3.9  CL 100 106  CO2 18* 24  GLUCOSE 144* 135*  BUN 26* 20  CREATININE 1.92* 1.13*  CALCIUM 8.6* 8.2*  AST 35  --   ALT 17  --   ALKPHOS 53  --   BILITOT 1.6*  --    Cardiac Enzymes No results for input(s): "TROPONINI" in the last 168 hours. RADIOLOGY:  CT CHEST ABDOMEN PELVIS WO CONTRAST  Result Date: 01/05/2023 CLINICAL DATA:  Sepsis EXAM: CT CHEST, ABDOMEN AND PELVIS WITHOUT CONTRAST TECHNIQUE: Multidetector CT imaging of the chest, abdomen and pelvis was performed following the standard protocol without IV contrast. RADIATION DOSE REDUCTION: This exam was performed according to the departmental dose-optimization program which includes automated exposure control, adjustment of the mA and/or kV according to patient size and/or use of iterative reconstruction technique. COMPARISON:  CT 05/07/2011 FINDINGS: CT CHEST FINDINGS Cardiovascular: Limited assessment without intravenous contrast.  Nonaneurysmal aorta. Mild atherosclerosis. Normal cardiac size. No pericardial effusion Mediastinum/Nodes: Midline trachea. No thyroid mass. No suspicious lymph nodes. Esophagus within normal limits Lungs/Pleura: Lungs are clear. No pleural effusion or pneumothorax. Musculoskeletal: No chest wall mass or suspicious bone lesions identified. CT ABDOMEN PELVIS FINDINGS Hepatobiliary: No focal liver abnormality is seen. No gallstones, gallbladder wall thickening, or biliary dilatation. Pancreas: Unremarkable. No pancreatic ductal dilatation or surrounding inflammatory changes. Spleen: Normal in size without focal abnormality. Adrenals/Urinary Tract: Adrenal glands are normal. Moderate left perinephric fat stranding. No hydronephrosis or hydroureter. Bladder is unremarkable Stomach/Bowel: Stomach is within normal limits. Appendix appears normal. No evidence of bowel wall thickening, distention, or inflammatory changes. Vascular/Lymphatic: Moderate aortic atherosclerosis. No aneurysm. No suspicious lymph nodes Reproductive: Status post hysterectomy. No adnexal masses. Other: Negative for pelvic effusion or free air Musculoskeletal: No acute or suspicious osseous abnormality IMPRESSION: 1. Moderate left perinephric fat stranding without hydronephrosis or hydroureter. Findings may be secondary to pyelonephritis or recently passed stone. 2. Negative for acute airspace disease. 3. Aortic atherosclerosis. Aortic Atherosclerosis (ICD10-I70.0). Electronically Signed   By: Jasmine Pang M.D.   On: 01/05/2023 19:48   DG Chest Port 1 View  Result Date: 01/05/2023 CLINICAL DATA:  Sepsis EXAM: PORTABLE CHEST 1 VIEW COMPARISON:  None Available. FINDINGS: Lung volumes are small. Mild elevation of the right hemidiaphragm, unchanged. Lungs are clear save for minimal right basilar atelectasis. No pneumothorax or pleural effusion. Cardiac size is within normal limits. Pulmonary vascularity is normal. No acute  bone abnormality.  IMPRESSION: 1. Pulmonary hypoinflation. Electronically Signed   By: Helyn Numbers M.D.   On: 01/05/2023 17:51    Assessment and Plan   Anna Wiggins is a 72 y.o. female with medical history significant of HTN, HLD, DM-type I, CKD-2, cervical cancer, GERD, who presents with chills and and urinary frequency.  Patient was initially hypotensive with blood pressure 73/50, which improved to 102/48 after giving 3 L IV fluid bolus in ED.   CT-chest/abd/pelvis: 1. Moderate left perinephric fat stranding without hydronephrosis or hydroureter. Findings may be secondary to pyelonephritis or recently passed stone. 2. Negative for acute airspace disease. 3. Aortic atherosclerosis  Acute pyelonephritis: Patient has positive urinalysis.  CT scan showed possible pyelonephritis.  Patient does not meets criteria for sepsis (WBC 14.8, heart rate 81, RR 20, temperature normal), but obviously patient is at high risk of developing severe sepsis since patient has elevated lactic acid 3.2 --> 3.3 and hypotension.  Her blood pressure was 73/50, which responded to IV fluid resuscitation.  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  Patient is taking Humalog, Ozempic and glargine insulin 90 units daily -Glargine insulin 65 unit daily -Sliding scale insulin   Acute renal failure superimposed on stage 2 chronic kidney disease (HCC): Likely multifactorial etiology including pyelonephritis, continuation of Micardis and spironolactone.  ATN is also possible due to hypotension -Avoid using renal toxic medications -Hold Micardis and spironolactone -IV fluid as above   Obesity (BMI 30-39.9):  Body weight 83.5 kg, BMI 30.23.  Patient is taking Ozempic weekly at home -Encouraged losing weight -Exercise and healthy diet       DVT ppx: SQ Lovenox  Code Status: Full code   Family Communication: none at bed side.      Level of care: Progressive Status is: Inpatient Remains inpatient appropriate because: acute pyelonephritis    TOTAL TIME TAKING CARE OF THIS PATIENT: 35 minutes.  >50% time spent on counselling and coordination of care  Note: This dictation was prepared with Dragon dictation along with smaller phrase technology. Any transcriptional errors that result from this process are unintentional.  Enedina Finner M.D    [facility]   CC: Primary care physician; Nira Retort

## 2023-01-06 NOTE — Consult Note (Signed)
PHARMACIST - PHYSICIAN COMMUNICATION  CONCERNING:  Enoxaparin (Lovenox) for DVT Prophylaxis    RECOMMENDATION: Patient was prescribed enoxaprin 40mg  q24 hours for VTE prophylaxis.   Filed Weights   01/05/23 1519  Weight: 87.5 kg (193 lb)    Body mass index is 30.23 kg/m.  Estimated Creatinine Clearance: 51.9 mL/min (A) (by C-G formula based on SCr of 1.13 mg/dL (H)).   Based on [facility] policy patient is candidate for enoxaparin 0.5mg /kg TBW SQ every 24 hours based on BMI being >30 and CrCl >30 mL/min.   DESCRIPTION: Pharmacy has adjusted enoxaparin dose per [facility] policy.  Patient is now receiving enoxaparin 45 mg every 24 hours   Celene Squibb, PharmD Clinical Pharmacist 01/06/2023 9:59 AM

## 2023-01-06 NOTE — Progress Notes (Signed)
CBG check requested by pharmacy; result 101. Patient taking PO well at this time. Advised patient to let us know if she feels her sugar dropping, patient states she can usually tell and will let us know. Will continue routine CBG checks as ordered.

## 2023-01-06 NOTE — ED Notes (Signed)
Pt provided ice water upon request.

## 2023-01-07 DIAGNOSIS — I10 Essential (primary) hypertension: Secondary | ICD-10-CM | POA: Diagnosis not present

## 2023-01-07 DIAGNOSIS — E785 Hyperlipidemia, unspecified: Secondary | ICD-10-CM

## 2023-01-07 DIAGNOSIS — N10 Acute tubulo-interstitial nephritis: Secondary | ICD-10-CM

## 2023-01-07 DIAGNOSIS — N179 Acute kidney failure, unspecified: Secondary | ICD-10-CM | POA: Diagnosis not present

## 2023-01-07 LAB — GLUCOSE, CAPILLARY
Glucose-Capillary: 82 mg/dL (ref 70–99)
Glucose-Capillary: 182 mg/dL — ABNORMAL HIGH (ref 70–99)
Glucose-Capillary: 108 mg/dL — ABNORMAL HIGH (ref 70–99)
Glucose-Capillary: 119 mg/dL — ABNORMAL HIGH (ref 70–99)

## 2023-01-07 LAB — URINE CULTURE: Culture: <10000 — AB

## 2023-01-07 NOTE — Progress Notes (Signed)
[facility]  -  at [facility]   PATIENT NAME: Anna Wiggins    MR#:  [id number]  DATE OF BIRTH:  05-07-1950  SUBJECTIVE:   Feeling much better.  Minimal pain VITALS:  Blood pressure (!) 147/73, pulse 66, temperature 98.1 F (36.7 C), resp. rate 16, height 5\' 7"  (1.702 m), weight 88.6 kg, SpO2 99%.  PHYSICAL EXAMINATION:   GENERAL:  72 y.o.-year-old patient with no acute distress. Morbid obesity LUNGS: Normal breath sounds bilaterally, no wheezing CARDIOVASCULAR: S1, S2 normal. No murmur   ABDOMEN: Soft, nontender, nondistended. Bowel sounds present.  EXTREMITIES: No  edema b/l.    NEUROLOGIC: nonfocal  patient is alert and awake SKIN: No obvious rash, lesion, or ulcer.   LABORATORY PANEL:  CBC Recent Labs  Lab 01/06/23 0415  WBC 18.3*  HGB 9.1*  HCT 29.2*  PLT 126*    Chemistries  Recent Labs  Lab 01/05/23 1535 01/06/23 0415  NA 131* 137  K 4.2 3.9  CL 100 106  CO2 18* 24  GLUCOSE 144* 135*  BUN 26* 20  CREATININE 1.92* 1.13*  CALCIUM 8.6* 8.2*  AST 35  --   ALT 17  --   ALKPHOS 53  --   BILITOT 1.6*  --    Cardiac Enzymes No results for input(s): "TROPONINI" in the last 168 hours. RADIOLOGY:  CT CHEST ABDOMEN PELVIS WO CONTRAST  Result Date: 01/05/2023 CLINICAL DATA:  Sepsis EXAM: CT CHEST, ABDOMEN AND PELVIS WITHOUT CONTRAST TECHNIQUE: Multidetector CT imaging of the chest, abdomen and pelvis was performed following the standard protocol without IV contrast. RADIATION DOSE REDUCTION: This exam was performed according to the departmental dose-optimization program which includes automated exposure control, adjustment of the mA and/or kV according to patient size and/or use of iterative reconstruction technique. COMPARISON:  CT 05/07/2011 FINDINGS: CT CHEST FINDINGS Cardiovascular: Limited assessment without intravenous contrast. Nonaneurysmal aorta. Mild atherosclerosis. Normal cardiac size. No pericardial effusion Mediastinum/Nodes:  Midline trachea. No thyroid mass. No suspicious lymph nodes. Esophagus within normal limits Lungs/Pleura: Lungs are clear. No pleural effusion or pneumothorax. Musculoskeletal: No chest wall mass or suspicious bone lesions identified. CT ABDOMEN PELVIS FINDINGS Hepatobiliary: No focal liver abnormality is seen. No gallstones, gallbladder wall thickening, or biliary dilatation. Pancreas: Unremarkable. No pancreatic ductal dilatation or surrounding inflammatory changes. Spleen: Normal in size without focal abnormality. Adrenals/Urinary Tract: Adrenal glands are normal. Moderate left perinephric fat stranding. No hydronephrosis or hydroureter. Bladder is unremarkable Stomach/Bowel: Stomach is within normal limits. Appendix appears normal. No evidence of bowel wall thickening, distention, or inflammatory changes. Vascular/Lymphatic: Moderate aortic atherosclerosis. No aneurysm. No suspicious lymph nodes Reproductive: Status post hysterectomy. No adnexal masses. Other: Negative for pelvic effusion or free air Musculoskeletal: No acute or suspicious osseous abnormality IMPRESSION: 1. Moderate left perinephric fat stranding without hydronephrosis or hydroureter. Findings may be secondary to pyelonephritis or recently passed stone. 2. Negative for acute airspace disease. 3. Aortic atherosclerosis. Aortic Atherosclerosis (ICD10-I70.0). Electronically Signed   By: Jasmine Pang M.D.   On: 01/05/2023 19:48   DG Chest Port 1 View  Result Date: 01/05/2023 CLINICAL DATA:  Sepsis EXAM: PORTABLE CHEST 1 VIEW COMPARISON:  None Available. FINDINGS: Lung volumes are small. Mild elevation of the right hemidiaphragm, unchanged. Lungs are clear save for minimal right basilar atelectasis. No pneumothorax or pleural effusion. Cardiac size is within normal limits. Pulmonary vascularity is normal. No acute bone abnormality. IMPRESSION: 1. Pulmonary hypoinflation. Electronically Signed   By: Helyn Numbers M.D.   On: 01/05/2023  17:51     Assessment and Plan   Anna Wiggins is a 72 y.o. female with medical history significant of HTN, HLD, DM-type I, CKD-2, cervical cancer, GERD, who presents with chills and and urinary frequency.  Patient was initially hypotensive with blood pressure 73/50, which improved to 102/48 after giving 3 L IV fluid bolus in ED.   CT-chest/abd/pelvis: 1. Moderate left perinephric fat stranding without hydronephrosis or hydroureter. Findings may be secondary to pyelonephritis or recently passed stone. 2. Negative for acute airspace disease. 3. Aortic atherosclerosis  Acute pyelonephritis: Patient has positive urinalysis.  CT scan showed possible pyelonephritis.  Patient does not meets criteria for sepsis (WBC 14.8, heart rate 81, RR 20, temperature normal), but obviously patient is at high risk of developing severe sepsis since patient has elevated lactic acid 3.2 --> 3.3-> 1.9 and hypotension.  Sepsis ruled out.  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  Patient is taking Humalog, Ozempic and glargine insulin 90 units daily -Glargine insulin 65 unit daily -Continue sliding scale insulin   Acute renal failure superimposed on stage 2 chronic kidney disease (HCC): Likely multifactorial etiology including pyelonephritis, continuation of Micardis and spironolactone.  ATN is also possible due to hypotension -Avoid using renal toxic medications -Hold Micardis and spironolactone -IV fluid as above   Obesity (BMI 30-39.9): Body weight 83.5 kg, BMI 30.23.  Patient is taking Ozempic weekly at home -Encouraged losing  weight -Exercise and healthy diet   Discontinue telemetry transfer to any MedSurg   DVT ppx: SQ Lovenox  Code Status: Full code   Family Communication: none at bed side.      Level of care: Med-Surg Status is: Inpatient Remains inpatient appropriate because: acute pyelonephritis.  Possible discharge tomorrow if continues to have improvement    TOTAL TIME TAKING CARE OF THIS PATIENT: 35 minutes.  >50% time spent on counselling and coordination of care  Note: This dictation was prepared with Dragon dictation along with smaller phrase technology. Any transcriptional errors that result from this process are unintentional.  Delfino Lovett M.D    [facility]   CC: Primary care physician; Nira Retort

## 2023-01-07 NOTE — Plan of Care (Signed)
  Problem: Education: Goal: Ability to describe self-care measures that may prevent or decrease complications (Diabetes Survival Skills Education) will improve Outcome: Progressing Goal: Individualized Educational Video(s) Outcome: Progressing   Problem: Coping: Goal: Ability to adjust to condition or change in health will improve Outcome: Progressing   Problem: Fluid Volume: Goal: Ability to maintain a balanced intake and output will improve Outcome: Progressing   Problem: Health Behavior/Discharge Planning: Goal: Ability to identify and utilize available resources and services will improve Outcome: Progressing Goal: Ability to manage health-related needs will improve Outcome: Progressing   Problem: Metabolic: Goal: Ability to maintain appropriate glucose levels will improve Outcome: Progressing   Problem: Skin Integrity: Goal: Risk for impaired skin integrity will decrease Outcome: Progressing   Problem: Nutritional: Goal: Maintenance of adequate nutrition will improve Outcome: Progressing Goal: Progress toward achieving an optimal weight will improve Outcome: Progressing   Problem: Education: Goal: Knowledge of General Education information will improve Description: Including pain rating scale, medication(s)/side effects and non-pharmacologic comfort measures Outcome: Progressing   Problem: Tissue Perfusion: Goal: Adequacy of tissue perfusion will improve Outcome: Progressing

## 2023-01-07 NOTE — Plan of Care (Signed)

## 2023-01-08 DIAGNOSIS — I10 Essential (primary) hypertension: Secondary | ICD-10-CM | POA: Diagnosis not present

## 2023-01-08 DIAGNOSIS — E109 Type 1 diabetes mellitus without complications: Secondary | ICD-10-CM

## 2023-01-08 DIAGNOSIS — E785 Hyperlipidemia, unspecified: Secondary | ICD-10-CM | POA: Diagnosis not present

## 2023-01-08 DIAGNOSIS — N10 Acute tubulo-interstitial nephritis: Secondary | ICD-10-CM | POA: Diagnosis not present

## 2023-01-08 LAB — BASIC METABOLIC PANEL
Anion gap: 7 (ref 5–15)
BUN: 15 mg/dL (ref 8–23)
CO2: 27 mmol/L (ref 22–32)
Calcium: 8.5 mg/dL — ABNORMAL LOW (ref 8.9–10.3)
Chloride: 106 mmol/L (ref 98–111)
Creatinine, Ser: 1.07 mg/dL — ABNORMAL HIGH (ref 0.44–1.00)
GFR, Estimated: 56 mL/min — ABNORMAL LOW (ref >60)
Glucose, Bld: 159 mg/dL — ABNORMAL HIGH (ref 70–99)
Potassium: 4.1 mmol/L (ref 3.5–5.1)
Sodium: 140 mmol/L (ref 135–145)

## 2023-01-08 LAB — CBC
HCT: 31.3 % — ABNORMAL LOW (ref 36.0–46.0)
Hemoglobin: 9.8 g/dL — ABNORMAL LOW (ref 12.0–15.0)
MCH: 22.6 pg — ABNORMAL LOW (ref 26.0–34.0)
MCHC: 31.3 g/dL (ref 30.0–36.0)
MCV: 72.3 fL — ABNORMAL LOW (ref 80.0–100.0)
Platelets: 167 10*3/uL (ref 150–400)
RBC: 4.33 MIL/uL (ref 3.87–5.11)
RDW: 13.8 % (ref 11.5–15.5)
WBC: 9.9 10*3/uL (ref 4.0–10.5)
nRBC: 0 % (ref 0.0–0.2)

## 2023-01-08 LAB — CULTURE, BLOOD (ROUTINE X 2)

## 2023-01-08 MED ORDER — CEPHALEXIN 500 MG PO CAPS: 500.0000 mg | ORAL_CAPSULE | Freq: Three times a day (TID) | ORAL | 0 refills | Status: AC

## 2023-01-08 NOTE — Progress Notes (Signed)
IV removed, discharge paperwork reviewed and all questions answered at this time. Patient is eating breakfast, states husband will be here soon to pick her up.

## 2023-01-08 NOTE — Progress Notes (Signed)
Anna Wiggins to be D/C'd Home per MD order.  Discussed with the patient and all questions fully answered.  VSS, Skin clean, dry and intact without evidence of skin break down, no evidence of skin tears noted. IV catheter discontinued intact. Site without signs and symptoms of complications. Dressing and pressure applied.  An After Visit Summary was printed and given to the patient. Patient prescription sent to her pharmacy.  D/c education completed with patient/family including follow up instructions, medication list, d/c activities limitations if indicated, with other d/c instructions as indicated by MD - patient able to verbalize understanding, all questions fully answered.   Patient instructed to return to ED, call 911, or call MD for any changes in condition.   Patient escorted via WC, and D/C home via private auto.  [name] [name] 01/08/2023 8:30 AM

## 2023-01-09 NOTE — Discharge Summary (Signed)
Physician Discharge Summary   Patient: Anna Wiggins MRN: [id number] DOB: [birth year]  Admit date:     01/05/2023  Discharge date: 01/08/2023  Discharge Physician: Delfino Lovett   PCP: Nira Retort   Recommendations at discharge:    F/up with outpt providers as requested  Discharge Diagnoses: Principal Problem:   Acute pyelonephritis Active Problems:   Essential hypertension   HLD (hyperlipidemia)   Type 1 diabetes mellitus (HCC)   Acute renal failure superimposed on stage 2 chronic kidney disease (HCC)   Obesity (BMI 30-39.9)  Hospital Course: Assessment and Plan:  Anna Wiggins is a 72 y.o. female with medical history significant of HTN, HLD, DM-type I, CKD-2, cervical cancer, GERD, who presents with chills and and urinary frequency.  Patient was initially hypotensive with blood pressure 73/50, which improved to 102/48 after giving 3 L IV fluid bolus in ED.    CT-chest/abd/pelvis: 1. Moderate left perinephric fat stranding without hydronephrosis or hydroureter. Findings may be secondary to pyelonephritis or recently passed stone. 2. Negative for acute airspace disease. 3. Aortic atherosclerosis   Acute pyelonephritis: Patient has positive urinalysis.  CT scan showed possible pyelonephritis.   Sepsis ruled out.  Blood pressure improved and normalized - Treated with IV Rocephin while in the Hospital with good response. BP improved and normalized. 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  ATN is also possible due to hypotension -improved with hydration   Obesity (BMI 30-39.9): Body weight 83.5 kg, BMI 30.23.  Patient is taking Ozempic weekly at home -Encouraged  losing weight -Exercise and healthy diet         Disposition: Home Diet recommendation:  Discharge Diet Orders (From admission, onward)     Start     Ordered   01/08/23 0000  Diet - low sodium heart healthy        01/08/23 0731           Carb modified diet DISCHARGE MEDICATION: Allergies as of 01/08/2023   No Known Allergies      Medication List     STOP taking these medications    fish oil-omega-3 fatty acids 1000 MG capsule   insulin glulisine 100 UNIT/ML injection Commonly known as: APIDRA       TAKE these medications    amLODipine 5 MG tablet Commonly known as: NORVASC Take 1 tablet (5 mg total) by mouth daily.   B-D UF III MINI PEN NEEDLES 31G X 5 MM Misc Generic drug: Insulin Pen Needle USE 4 DAILY   carvedilol 25 MG tablet Commonly known as: COREG Take 25 mg by mouth 2 (two) times daily.   cephALEXin 500 MG capsule Commonly known as: KEFLEX Take 1 capsule (500 mg total) by mouth 3 (three) times daily for 3 days.   Insulin Glargine 300 UNIT/ML Sopn Inject 90 Units into the skin every morning.   insulin lispro 100 UNIT/ML injection Commonly known as: HUMALOG Inject 15-18 Units into the skin 3 (three) times daily before meals. Patient takes 18 units at breakfast and 15 units at lunch and dinner   Insulin Syringe-Needle U-100 30G X 1/2" 0.3 ML Misc Inject 1 Syringe as directed 2 (two) times daily.   Insulin Syringe-Needle U-100 30G X 1/2" 1 ML Misc Commonly known as: B-D INS SYR  ULTRAFINE 1CC/30G USE AS DIRECTED   multivitamin with minerals tablet Take 1 tablet by mouth daily.   ONE TOUCH ULTRA TEST test strip Generic drug: glucose blood Use every day What changed: Another medication with the same name was removed. Continue taking this medication, and follow the directions you see here.   Ozempic (1 MG/DOSE) 4 MG/3ML Sopn Generic drug: Semaglutide (1 MG/DOSE) Inject 1 mg into the skin once a week.   pantoprazole 40 MG  tablet Commonly known as: PROTONIX Take 40 mg by mouth daily.   simvastatin 40 MG tablet Commonly known as: ZOCOR TAKE 1 TABLET BY MOUTH AT  BEDTIME   spironolactone 50 MG tablet Commonly known as: ALDACTONE Take 50 mg by mouth daily.   telmisartan 80 MG tablet Commonly known as: MICARDIS Take 1 tablet by mouth daily.        Follow-up Information     [facility], [facility]. Schedule an appointment as soon as possible for a visit in 1 week(s).   Why: [facility] Discharge F/UP Contact information: [address] [place] [address] [phone]                Discharge Exam: Ceasar Mons Weights   01/05/23 1519 01/07/23 0435  Weight: 87.5 kg 88.6 kg   GENERAL:  72 y.o.-year-old patient with no acute distress. Morbid obesity LUNGS: Normal breath sounds bilaterally, no wheezing CARDIOVASCULAR: S1, S2 normal. No murmur   ABDOMEN: Soft, nontender, nondistended. Bowel sounds present.  EXTREMITIES: No  edema b/l.    NEUROLOGIC: nonfocal  patient is alert and awake SKIN: No obvious rash, lesion, or ulcer.   Condition at discharge: good  The results of significant diagnostics from this hospitalization (including imaging, microbiology, ancillary and laboratory) are listed below for reference.   Imaging Studies: CT CHEST ABDOMEN PELVIS WO CONTRAST  Result Date: 01/05/2023 CLINICAL DATA:  Sepsis EXAM: CT CHEST, ABDOMEN AND PELVIS WITHOUT CONTRAST TECHNIQUE: Multidetector CT imaging of the chest, abdomen and pelvis was performed following the standard protocol without IV contrast. RADIATION DOSE REDUCTION: This exam was performed according to the departmental dose-optimization program which includes automated exposure control, adjustment of the mA and/or kV according to patient size and/or use of iterative reconstruction technique. COMPARISON:  CT 05/07/2011 FINDINGS: CT CHEST FINDINGS Cardiovascular: Limited assessment without intravenous contrast. Nonaneurysmal aorta.  Mild atherosclerosis. Normal cardiac size. No pericardial effusion Mediastinum/Nodes: Midline trachea. No thyroid mass. No suspicious lymph nodes. Esophagus within normal limits Lungs/Pleura: Lungs are clear. No pleural effusion or pneumothorax. Musculoskeletal: No chest wall mass or suspicious bone lesions identified. CT ABDOMEN PELVIS FINDINGS Hepatobiliary: No focal liver abnormality is seen. No gallstones, gallbladder wall thickening, or biliary dilatation. Pancreas: Unremarkable. No pancreatic ductal dilatation or surrounding inflammatory changes. Spleen: Normal in size without focal abnormality. Adrenals/Urinary Tract: Adrenal glands are normal. Moderate left perinephric fat stranding. No hydronephrosis or hydroureter. Bladder is unremarkable Stomach/Bowel: Stomach is within normal limits. Appendix appears normal. No evidence of bowel wall thickening, distention, or inflammatory changes. Vascular/Lymphatic: Moderate aortic atherosclerosis. No aneurysm. No suspicious lymph nodes Reproductive: Status post hysterectomy. No adnexal masses. Other: Negative for pelvic effusion or free air Musculoskeletal: No acute or suspicious osseous abnormality IMPRESSION: 1. Moderate left perinephric fat stranding without hydronephrosis or hydroureter. Findings may be secondary to pyelonephritis or recently passed stone. 2. Negative for acute airspace disease. 3. Aortic atherosclerosis. Aortic Atherosclerosis (ICD10-I70.0). Electronically Signed   By: Jasmine Pang M.D.   On: 01/05/2023 19:48   DG Chest [facility]  Result Date: 01/05/2023 CLINICAL DATA:  Sepsis EXAM: PORTABLE CHEST 1 VIEW COMPARISON:  None Available. FINDINGS: Lung volumes are small. Mild elevation of the right hemidiaphragm, unchanged. Lungs are clear save for minimal right basilar atelectasis. No pneumothorax or pleural effusion. Cardiac size is within normal limits. Pulmonary vascularity is normal. No acute bone abnormality. IMPRESSION: 1. Pulmonary  hypoinflation. Electronically Signed   By: Helyn Numbers M.D.   On: 01/05/2023 17:51    Microbiology: Results for orders placed or performed during the hospital encounter of 01/05/23  SARS Coronavirus 2 by RT PCR (hospital order, performed in [facility] hospital lab) *cepheid single result test* Anterior Nasal Swab     Status: None   Collection Time: 01/05/23  3:22 PM   Specimen: Anterior Nasal Swab  Result Value Ref Range Status   SARS Coronavirus 2 by RT PCR NEGATIVE NEGATIVE Final    Comment: (NOTE) SARS-CoV-2 target nucleic acids are NOT DETECTED.  The SARS-CoV-2 RNA is generally detectable in upper and lower respiratory specimens during the acute phase of infection. The lowest concentration of SARS-CoV-2 viral copies this assay can detect is 250 copies / mL. A negative result does not preclude SARS-CoV-2 infection and should not be used as the sole basis for treatment or other patient management decisions.  A negative result may occur with improper specimen collection / handling, submission of specimen other than nasopharyngeal swab, presence of viral mutation(s) within the areas targeted by this assay, and inadequate number of viral copies (<250 copies / mL). A negative result must be combined with clinical observations, patient history, and epidemiological information.  Fact Sheet for Patients:   RoadLapTop.co.za  Fact Sheet for Healthcare Providers: http://kim-miller.com/  This test is not yet approved or  cleared by the [place] FDA and has been authorized for detection and/or diagnosis of SARS-CoV-2 by FDA under an Emergency Use Authorization (EUA).  This EUA will remain in effect (meaning this test can be used) for the duration of the COVID-19 declaration under Section 564(b)(1) of the Act, 21 U.S.C. section 360bbb-3(b)(1), unless the authorization is terminated or revoked sooner.  Performed at [facility],  [address] [place], [place] [address]   Culture, blood (Routine x 2)     Status: None (Preliminary result)   Collection Time: 01/05/23  4:22 PM   Specimen: BLOOD  Result Value Ref Range Status   Specimen Description BLOOD BLOOD LEFT HAND  Final   Special Requests   Final    BOTTLES DRAWN AEROBIC AND ANAEROBIC Blood Culture results may not be optimal due to an inadequate volume of blood received in culture bottles   Culture   Final    NO GROWTH 4 DAYS Performed at [facility], [address] [place], [place] [address]    Report Status PENDING  Incomplete  Culture, blood (Routine x 2)     Status: None (Preliminary result)   Collection Time: 01/05/23  4:22 PM   Specimen: BLOOD  Result Value Ref Range Status   Specimen Description BLOOD LEFT ANTECUBITAL  Final   Special Requests   Final    BOTTLES DRAWN AEROBIC AND ANAEROBIC Blood Culture results may not be optimal due to an inadequate volume of blood received in culture bottles   Culture   Final    NO GROWTH 4 DAYS Performed at [facility], [address], [place], [place] [address]    Report Status PENDING  Incomplete  Urine Culture     Status: Abnormal  Collection Time: 01/05/23  7:04 PM   Specimen: Urine, Random  Result Value Ref Range Status   Specimen Description   Final    URINE, RANDOM Performed at [facility], [address], [place], [place] [address]    Special Requests   Final    NONE Reflexed from [id number] Performed at [facility], [address] [place], [place] [address]    Culture (A)  Final    <10,000 COLONIES/mL INSIGNIFICANT GROWTH Performed at [facility], [address], [place], [place] [address]    Report Status 01/07/2023 FINAL  Final    Labs: CBC: Recent Labs  Lab 01/05/23 1535 01/06/23 0415 01/08/23 0400  WBC 14.8* 18.3* 9.9  NEUTROABS 13.0*  --   --   HGB 10.8* 9.1* 9.8*  HCT 34.4* 29.2* 31.3*  MCV 73.7* 74.7* 72.3*  PLT 164 126*  167   Basic Metabolic Panel: Recent Labs  Lab 01/05/23 1535 01/06/23 0415 01/08/23 0400  NA 131* 137 140  K 4.2 3.9 4.1  CL 100 106 106  CO2 18* 24 27  GLUCOSE 144* 135* 159*  BUN 26* 20 15  CREATININE 1.92* 1.13* 1.07*  CALCIUM 8.6* 8.2* 8.5*   Liver Function Tests: Recent Labs  Lab 01/05/23 1535  AST 35  ALT 17  ALKPHOS 53  BILITOT 1.6*  PROT 7.5  ALBUMIN 3.4*   CBG: Recent Labs  Lab 01/06/23 2113 01/07/23 0814 01/07/23 1103 01/07/23 1651 01/07/23 2139  GLUCAP 127* 82 182* 108* 119*    Discharge time spent: greater than 30 minutes.  Signed: Delfino Lovett, MD [facility] 01/09/2023

## 2023-01-10 LAB — CULTURE, BLOOD (ROUTINE X 2)
Culture: NO GROWTH
Culture: NO GROWTH

## 2023-06-09 ENCOUNTER — Other Ambulatory Visit: Payer: Self-pay

## 2023-06-09 ENCOUNTER — Emergency Department
Admission: EM | Admit: 2023-06-09 | Discharge: 2023-06-09 | Disposition: A | Discharge status: Home / Self Care | Payer: Medicare Other | Attending: Emergency Medicine | Admitting: Emergency Medicine

## 2023-06-09 DIAGNOSIS — R1031 Right lower quadrant pain: Secondary | ICD-10-CM | POA: Diagnosis not present

## 2023-06-09 DIAGNOSIS — N390 Urinary tract infection, site not specified: Secondary | ICD-10-CM | POA: Insufficient documentation

## 2023-06-09 DIAGNOSIS — R112 Nausea with vomiting, unspecified: Secondary | ICD-10-CM | POA: Insufficient documentation

## 2023-06-09 DIAGNOSIS — I10 Essential (primary) hypertension: Secondary | ICD-10-CM | POA: Diagnosis not present

## 2023-06-09 DIAGNOSIS — R1032 Left lower quadrant pain: Secondary | ICD-10-CM | POA: Insufficient documentation

## 2023-06-09 DIAGNOSIS — E109 Type 1 diabetes mellitus without complications: Secondary | ICD-10-CM | POA: Diagnosis not present

## 2023-06-09 DIAGNOSIS — R319 Hematuria, unspecified: Secondary | ICD-10-CM | POA: Insufficient documentation

## 2023-06-09 DIAGNOSIS — R197 Diarrhea, unspecified: Secondary | ICD-10-CM | POA: Diagnosis not present

## 2023-06-09 LAB — URINALYSIS, ROUTINE W REFLEX MICROSCOPIC
Bilirubin Urine: NEGATIVE
Glucose, UA: NEGATIVE mg/dL
Ketones, ur: NEGATIVE mg/dL
Leukocytes,Ua: NEGATIVE
Nitrite: POSITIVE — AB
Protein, ur: NEGATIVE mg/dL
Specific Gravity, Urine: 1.011 (ref 1.005–1.030)
pH: 6 (ref 5.0–8.0)

## 2023-06-09 LAB — CBC
HCT: 40.7 % (ref 36.0–46.0)
Hemoglobin: 12.5 g/dL (ref 12.0–15.0)
MCH: 22.9 pg — ABNORMAL LOW (ref 26.0–34.0)
MCHC: 30.7 g/dL (ref 30.0–36.0)
MCV: 74.5 fL — ABNORMAL LOW (ref 80.0–100.0)
Platelets: 203 10*3/uL (ref 150–400)
RBC: 5.46 MIL/uL — ABNORMAL HIGH (ref 3.87–5.11)
RDW: 14.1 % (ref 11.5–15.5)
WBC: 9.3 10*3/uL (ref 4.0–10.5)
nRBC: 0 % (ref 0.0–0.2)

## 2023-06-09 LAB — COMPREHENSIVE METABOLIC PANEL
ALT: 23 U/L (ref 0–44)
AST: 19 U/L (ref 15–41)
Albumin: 4 g/dL (ref 3.5–5.0)
Alkaline Phosphatase: 61 U/L (ref 38–126)
Anion gap: 10 (ref 5–15)
BUN: 10 mg/dL (ref 8–23)
CO2: 22 mmol/L (ref 22–32)
Calcium: 9.4 mg/dL (ref 8.9–10.3)
Chloride: 102 mmol/L (ref 98–111)
Creatinine, Ser: 0.84 mg/dL (ref 0.44–1.00)
GFR, Estimated: >60 mL/min (ref >60)
Glucose, Bld: 129 mg/dL — ABNORMAL HIGH (ref 70–99)
Potassium: 3.9 mmol/L (ref 3.5–5.1)
Sodium: 134 mmol/L — ABNORMAL LOW (ref 135–145)
Total Bilirubin: 0.9 mg/dL (ref 0.0–1.2)
Total Protein: 7.9 g/dL (ref 6.5–8.1)

## 2023-06-09 LAB — LIPASE, BLOOD: Lipase: 40 U/L (ref 11–51)

## 2023-06-09 MED ORDER — ONDANSETRON 8 MG PO TBDP: 8.0000 mg | ORAL_TABLET | Freq: Three times a day (TID) | ORAL | 0 refills | Status: AC | PRN

## 2023-06-09 MED ORDER — LACTATED RINGERS IV BOLUS
1000.0000 mL | Freq: Once | INTRAVENOUS | Status: AC
  Administered 2023-06-09: 1000 mL via INTRAVENOUS

## 2023-06-09 MED ORDER — CEFUROXIME AXETIL 250 MG PO TABS: 250.0000 mg | ORAL_TABLET | Freq: Two times a day (BID) | ORAL | 0 refills | Status: AC

## 2023-06-09 MED ORDER — SODIUM CHLORIDE 0.9 % IV SOLN
1.0000 g | Freq: Once | INTRAVENOUS | Status: AC
  Administered 2023-06-09: 1 g via INTRAVENOUS
  Filled 2023-06-09: qty 10

## 2023-06-09 MED ORDER — ONDANSETRON HCL 4 MG/2ML IJ SOLN
4.0000 mg | Freq: Once | INTRAMUSCULAR | Status: AC
  Administered 2023-06-09: 4 mg via INTRAVENOUS
  Filled 2023-06-09: qty 2

## 2023-06-09 NOTE — ED Provider Notes (Signed)
 [facility] Provider Note   Event Date/Time   First MD Initiated Contact with Patient 06/09/23 1909     (approximate) History  GI Problem  HPI Anna Wiggins is a 73 y.o. female with past medical history of type 1 diabetes, hypertension, hyperlipidemia who presents via POV after episodes of nausea/vomiting/diarrhea that began 24 hours prior to arrival with associated dysuria.  Patient states that she has had some bright red blood in her stool.  Patient denies any recent travel or sick contacts.  Patient denies any new medications or food out of the ordinary.  Patient denies any fevers.  Patient does endorse bilateral abdominal bloating and pain that is worse with vomiting ROS: Patient currently denies any vision changes, tinnitus, difficulty speaking, facial droop, sore throat, chest pain, shortness of breath, dysuria, or weakness/numbness/paresthesias in any extremity   Physical Exam  Triage Vital Signs: ED Triage Vitals  Encounter Vitals Group     BP 06/09/23 1533 131/70     Systolic BP Percentile --      Diastolic BP Percentile --      Pulse Rate 06/09/23 1533 71     Resp 06/09/23 1533 20     Temp 06/09/23 1533 98.7 F (37.1 C)     Temp Source 06/09/23 1533 Oral     SpO2 06/09/23 1533 95 %     Weight 06/09/23 1530 190 lb (86.2 kg)     Height 06/09/23 1530 5\' 7"  (1.702 m)     Head Circumference --      Peak Flow --      Pain Score 06/09/23 1530 0     Pain Loc --      Pain Education --      Exclude from Growth Chart --    Most recent vital signs: Vitals:   06/09/23 1533 06/09/23 1930  BP: 131/70 (!) 161/73  Pulse: 71 67  Resp: 20 19  Temp: 98.7 F (37.1 C)   SpO2: 95% 100%   General: Awake, oriented x4. CV:  Good peripheral perfusion.  Resp:  Normal effort.  Abd:  No distention.  Mild bilateral lower quadrant tenderness to palpation Other:  Elderly overweight African-American female resting comfortably in no acute distress ED Results /  Procedures / Treatments  Labs (all labs ordered are listed, but only abnormal results are displayed) Labs Reviewed  COMPREHENSIVE METABOLIC PANEL - Abnormal; Notable for the following components:      Result Value   Sodium 134 (*)    Glucose, Bld 129 (*)    All other components within normal limits  CBC - Abnormal; Notable for the following components:   RBC 5.46 (*)    MCV 74.5 (*)    MCH 22.9 (*)    All other components within normal limits  URINALYSIS, ROUTINE W REFLEX MICROSCOPIC - Abnormal; Notable for the following components:   Color, Urine YELLOW (*)    APPearance HAZY (*)    Hgb urine dipstick SMALL (*)    Nitrite POSITIVE (*)    Bacteria, UA MANY (*)    All other components within normal limits  LIPASE, BLOOD   PROCEDURES: Critical Care performed: No Procedures MEDICATIONS ORDERED IN ED: Medications  ondansetron (ZOFRAN) injection 4 mg (4 mg Intravenous Given 06/09/23 1942)  lactated ringers bolus 1,000 mL (0 mLs Intravenous Stopped 06/09/23 2308)  cefTRIAXone (ROCEPHIN) 1 g in sodium chloride 0.9 % 100 mL IVPB (0 g Intravenous Stopped 06/09/23 2309)   IMPRESSION / MDM /  ASSESSMENT AND PLAN / ED COURSE  I reviewed the triage vital signs and the nursing notes.                             The patient is on the cardiac monitor to evaluate for evidence of arrhythmia and/or significant heart rate changes. Patient's presentation is most consistent with acute presentation with potential threat to life or bodily function. Patient presents for acute nausea/vomiting The cause of the patient's symptoms is not clear, but the patient is overall well appearing and is suspected to have a transient course of illness.  Given History and Exam there does not appear to be an emergent cause of the symptoms such as small bowel obstruction, coronary syndrome, bowel ischemia, DKA, pancreatitis, appendicitis, other acute abdomen or other emergent problem.  Reassessment: After treatment, the  patient is feeling much better, tolerating PO fluids, and shows no signs of dehydration.  Not Pregnant. Unlikely TOA, Ovarian Torsion, PID, gonorrhea/chlamydia. Low suspicion for Infected Urolithiasis, AAA, Cholecystitis, Pancreatitis, SBO, Appendicitis, or other acute abdomen.  Rx: Zofran, Ceftin Disposition: Discharge home with prompt primary care physician follow up in the next 48 hours. Strict return precautions discussed.   FINAL CLINICAL IMPRESSION(S) / ED DIAGNOSES   Final diagnoses:  Nausea vomiting and diarrhea  Urinary tract infection with hematuria, site unspecified   Rx / DC Orders   ED Discharge Orders          Ordered    cefUROXime (CEFTIN) 250 MG tablet  2 times daily with meals        06/09/23 2222    ondansetron (ZOFRAN-ODT) 8 MG disintegrating tablet  Every 8 hours PRN        06/09/23 2222           Note:  This document was prepared using Dragon voice recognition software and may include unintentional dictation errors.   Merwyn Katos, MD 06/09/23 [phone]

## 2023-06-09 NOTE — ED Triage Notes (Addendum)
 Patient states she had episodes of vomiting last night; today has had bright red blood in her stool; denies abdominal pain; denies blood thinners.
